# Patient Record
Sex: Female | Born: 1950 | Race: Black or African American | Hispanic: No | Marital: Single | Smoking: Former smoker
Health system: Southern US, Community
[De-identification: ages and names within clinical notes are randomized; demographics above are authoritative.]

## PROBLEM LIST (undated history)

## (undated) DIAGNOSIS — E785 Hyperlipidemia, unspecified: Secondary | ICD-10-CM

## (undated) DIAGNOSIS — I10 Essential (primary) hypertension: Secondary | ICD-10-CM

## (undated) DIAGNOSIS — M719 Bursopathy, unspecified: Secondary | ICD-10-CM

## (undated) HISTORY — PX: BREAST EXCISIONAL BIOPSY: SUR124

## (undated) HISTORY — PX: BYPASS GRAFT: SHX909

## (undated) HISTORY — DX: Bursopathy, unspecified: M71.9

## (undated) HISTORY — PX: REPLACEMENT TOTAL KNEE: SUR1224

## (undated) HISTORY — DX: Essential (primary) hypertension: I10

## (undated) HISTORY — DX: Hyperlipidemia, unspecified: E78.5

---

## 1997-12-25 ENCOUNTER — Ambulatory Visit (HOSPITAL_BASED_OUTPATIENT_CLINIC_OR_DEPARTMENT_OTHER): Admission: RE | Admit: 1997-12-25 | Discharge: 1997-12-25 | Payer: Self-pay | Admitting: Orthopedic Surgery

## 1998-02-21 ENCOUNTER — Inpatient Hospital Stay (HOSPITAL_COMMUNITY): Admission: RE | Admit: 1998-02-21 | Discharge: 1998-02-28 | Payer: Self-pay | Admitting: Orthopedic Surgery

## 1998-02-28 ENCOUNTER — Inpatient Hospital Stay (HOSPITAL_COMMUNITY)
Admission: RE | Admit: 1998-02-28 | Discharge: 1998-03-02 | Payer: Self-pay | Admitting: Physical Medicine and Rehabilitation

## 1998-05-14 ENCOUNTER — Ambulatory Visit (HOSPITAL_BASED_OUTPATIENT_CLINIC_OR_DEPARTMENT_OTHER): Admission: RE | Admit: 1998-05-14 | Discharge: 1998-05-14 | Payer: Self-pay | Admitting: Orthopedic Surgery

## 1998-07-13 ENCOUNTER — Ambulatory Visit (HOSPITAL_COMMUNITY): Admission: RE | Admit: 1998-07-13 | Discharge: 1998-07-13 | Payer: Self-pay | Admitting: *Deleted

## 1999-04-11 ENCOUNTER — Other Ambulatory Visit: Admission: RE | Admit: 1999-04-11 | Discharge: 1999-04-11 | Payer: Self-pay | Admitting: Obstetrics and Gynecology

## 1999-06-07 ENCOUNTER — Ambulatory Visit (HOSPITAL_COMMUNITY): Admission: RE | Admit: 1999-06-07 | Discharge: 1999-06-07 | Payer: Self-pay | Admitting: *Deleted

## 1999-06-07 ENCOUNTER — Encounter: Payer: Self-pay | Admitting: *Deleted

## 1999-08-26 ENCOUNTER — Ambulatory Visit (HOSPITAL_COMMUNITY): Admission: RE | Admit: 1999-08-26 | Discharge: 1999-08-26 | Payer: Self-pay | Admitting: Cardiology

## 1999-08-27 ENCOUNTER — Encounter: Payer: Self-pay | Admitting: Cardiology

## 1999-09-12 ENCOUNTER — Encounter: Payer: Self-pay | Admitting: Cardiothoracic Surgery

## 1999-09-12 ENCOUNTER — Inpatient Hospital Stay (HOSPITAL_COMMUNITY): Admission: RE | Admit: 1999-09-12 | Discharge: 1999-09-19 | Payer: Self-pay | Admitting: Cardiology

## 1999-09-13 ENCOUNTER — Encounter: Payer: Self-pay | Admitting: Cardiothoracic Surgery

## 1999-09-14 ENCOUNTER — Encounter: Payer: Self-pay | Admitting: Cardiothoracic Surgery

## 1999-09-15 ENCOUNTER — Encounter: Payer: Self-pay | Admitting: Cardiothoracic Surgery

## 1999-09-16 ENCOUNTER — Encounter: Payer: Self-pay | Admitting: Cardiothoracic Surgery

## 1999-09-17 ENCOUNTER — Encounter: Payer: Self-pay | Admitting: Cardiothoracic Surgery

## 1999-10-01 ENCOUNTER — Encounter (HOSPITAL_COMMUNITY): Admission: RE | Admit: 1999-10-01 | Discharge: 1999-10-27 | Payer: Self-pay | Admitting: Cardiology

## 1999-10-10 ENCOUNTER — Encounter: Admission: RE | Admit: 1999-10-10 | Discharge: 1999-10-10 | Payer: Self-pay | Admitting: Cardiothoracic Surgery

## 1999-10-10 ENCOUNTER — Encounter: Payer: Self-pay | Admitting: Cardiothoracic Surgery

## 1999-10-28 ENCOUNTER — Encounter (HOSPITAL_COMMUNITY): Admission: RE | Admit: 1999-10-28 | Discharge: 2000-01-26 | Payer: Self-pay | Admitting: Cardiology

## 1999-10-31 ENCOUNTER — Encounter: Payer: Self-pay | Admitting: Cardiothoracic Surgery

## 1999-10-31 ENCOUNTER — Encounter: Admission: RE | Admit: 1999-10-31 | Discharge: 1999-10-31 | Payer: Self-pay | Admitting: Cardiothoracic Surgery

## 1999-11-25 ENCOUNTER — Ambulatory Visit (HOSPITAL_COMMUNITY): Admission: RE | Admit: 1999-11-25 | Discharge: 1999-11-25 | Payer: Self-pay | Admitting: Cardiology

## 1999-11-25 ENCOUNTER — Encounter: Payer: Self-pay | Admitting: Cardiology

## 1999-12-27 ENCOUNTER — Ambulatory Visit (HOSPITAL_COMMUNITY): Admission: RE | Admit: 1999-12-27 | Discharge: 1999-12-27 | Payer: Self-pay | Admitting: Cardiology

## 1999-12-27 ENCOUNTER — Encounter: Payer: Self-pay | Admitting: Cardiology

## 2000-01-09 ENCOUNTER — Ambulatory Visit (HOSPITAL_COMMUNITY): Admission: RE | Admit: 2000-01-09 | Discharge: 2000-01-09 | Payer: Self-pay | Admitting: Cardiology

## 2000-01-27 ENCOUNTER — Encounter (HOSPITAL_COMMUNITY): Admission: RE | Admit: 2000-01-27 | Discharge: 2000-04-26 | Payer: Self-pay | Admitting: Cardiology

## 2000-04-27 ENCOUNTER — Encounter (HOSPITAL_COMMUNITY): Admission: RE | Admit: 2000-04-27 | Discharge: 2000-07-26 | Payer: Self-pay | Admitting: Cardiology

## 2000-06-30 ENCOUNTER — Ambulatory Visit (HOSPITAL_COMMUNITY): Admission: RE | Admit: 2000-06-30 | Discharge: 2000-06-30 | Payer: Self-pay | Admitting: Cardiology

## 2000-06-30 ENCOUNTER — Encounter: Payer: Self-pay | Admitting: Cardiology

## 2000-07-27 ENCOUNTER — Encounter (HOSPITAL_COMMUNITY): Admission: RE | Admit: 2000-07-27 | Discharge: 2000-10-25 | Payer: Self-pay | Admitting: Cardiology

## 2000-10-26 ENCOUNTER — Encounter (HOSPITAL_COMMUNITY): Admission: RE | Admit: 2000-10-26 | Discharge: 2001-01-24 | Payer: Self-pay | Admitting: Cardiology

## 2000-11-18 ENCOUNTER — Other Ambulatory Visit: Admission: RE | Admit: 2000-11-18 | Discharge: 2000-11-18 | Payer: Self-pay | Admitting: Obstetrics and Gynecology

## 2001-01-25 ENCOUNTER — Encounter (HOSPITAL_COMMUNITY): Admission: RE | Admit: 2001-01-25 | Discharge: 2001-04-25 | Payer: Self-pay | Admitting: Cardiology

## 2002-01-18 ENCOUNTER — Other Ambulatory Visit: Admission: RE | Admit: 2002-01-18 | Discharge: 2002-01-18 | Payer: Self-pay | Admitting: Obstetrics and Gynecology

## 2002-09-02 ENCOUNTER — Ambulatory Visit (HOSPITAL_COMMUNITY): Admission: RE | Admit: 2002-09-02 | Discharge: 2002-09-02 | Payer: Self-pay | Admitting: Cardiology

## 2003-03-01 ENCOUNTER — Ambulatory Visit (HOSPITAL_COMMUNITY): Admission: RE | Admit: 2003-03-01 | Discharge: 2003-03-01 | Payer: Self-pay | Admitting: Cardiology

## 2003-10-30 ENCOUNTER — Encounter: Admission: RE | Admit: 2003-10-30 | Discharge: 2003-10-30 | Payer: Self-pay | Admitting: Cardiology

## 2003-11-23 ENCOUNTER — Ambulatory Visit (HOSPITAL_COMMUNITY): Admission: RE | Admit: 2003-11-23 | Discharge: 2003-11-23 | Payer: Self-pay | Admitting: Obstetrics & Gynecology

## 2004-07-29 ENCOUNTER — Ambulatory Visit (HOSPITAL_COMMUNITY): Admission: RE | Admit: 2004-07-29 | Discharge: 2004-07-29 | Payer: Self-pay | Admitting: Cardiology

## 2004-07-30 ENCOUNTER — Ambulatory Visit (HOSPITAL_COMMUNITY): Admission: RE | Admit: 2004-07-30 | Discharge: 2004-07-30 | Payer: Self-pay | Admitting: Cardiology

## 2004-10-28 ENCOUNTER — Ambulatory Visit (HOSPITAL_BASED_OUTPATIENT_CLINIC_OR_DEPARTMENT_OTHER): Admission: RE | Admit: 2004-10-28 | Discharge: 2004-10-28 | Payer: Self-pay | Admitting: Cardiology

## 2004-10-28 ENCOUNTER — Ambulatory Visit: Payer: Self-pay | Admitting: Internal Medicine

## 2004-12-30 ENCOUNTER — Encounter: Admission: RE | Admit: 2004-12-30 | Discharge: 2004-12-30 | Payer: Self-pay | Admitting: Obstetrics & Gynecology

## 2005-08-04 ENCOUNTER — Encounter: Admission: RE | Admit: 2005-08-04 | Discharge: 2005-08-04 | Payer: Self-pay | Admitting: Cardiology

## 2005-08-21 ENCOUNTER — Ambulatory Visit (HOSPITAL_COMMUNITY): Admission: RE | Admit: 2005-08-21 | Discharge: 2005-08-21 | Payer: Self-pay | Admitting: Cardiology

## 2006-02-24 ENCOUNTER — Encounter: Admission: RE | Admit: 2006-02-24 | Discharge: 2006-02-24 | Payer: Self-pay | Admitting: Obstetrics & Gynecology

## 2006-09-24 ENCOUNTER — Encounter: Admission: RE | Admit: 2006-09-24 | Discharge: 2006-09-24 | Payer: Self-pay | Admitting: Gastroenterology

## 2006-12-16 ENCOUNTER — Encounter (HOSPITAL_COMMUNITY): Admission: RE | Admit: 2006-12-16 | Discharge: 2006-12-16 | Payer: Self-pay | Admitting: Cardiology

## 2007-02-26 ENCOUNTER — Encounter: Admission: RE | Admit: 2007-02-26 | Discharge: 2007-02-26 | Payer: Self-pay | Admitting: Obstetrics & Gynecology

## 2007-03-01 ENCOUNTER — Encounter: Admission: RE | Admit: 2007-03-01 | Discharge: 2007-03-01 | Payer: Self-pay | Admitting: Interventional Cardiology

## 2007-03-04 ENCOUNTER — Encounter: Admission: RE | Admit: 2007-03-04 | Discharge: 2007-03-04 | Payer: Self-pay | Admitting: Obstetrics & Gynecology

## 2007-03-08 ENCOUNTER — Inpatient Hospital Stay (HOSPITAL_BASED_OUTPATIENT_CLINIC_OR_DEPARTMENT_OTHER): Admission: RE | Admit: 2007-03-08 | Discharge: 2007-03-08 | Payer: Self-pay | Admitting: Interventional Cardiology

## 2007-09-06 ENCOUNTER — Ambulatory Visit (HOSPITAL_COMMUNITY): Admission: RE | Admit: 2007-09-06 | Discharge: 2007-09-06 | Payer: Self-pay | Admitting: Cardiology

## 2007-09-09 ENCOUNTER — Encounter (INDEPENDENT_AMBULATORY_CARE_PROVIDER_SITE_OTHER): Payer: Self-pay | Admitting: Cardiology

## 2007-09-09 ENCOUNTER — Ambulatory Visit (HOSPITAL_COMMUNITY): Admission: RE | Admit: 2007-09-09 | Discharge: 2007-09-09 | Payer: Self-pay | Admitting: Cardiology

## 2007-09-09 ENCOUNTER — Ambulatory Visit: Payer: Self-pay | Admitting: *Deleted

## 2008-03-28 ENCOUNTER — Encounter: Admission: RE | Admit: 2008-03-28 | Discharge: 2008-03-28 | Payer: Self-pay | Admitting: Obstetrics & Gynecology

## 2008-04-11 ENCOUNTER — Ambulatory Visit (HOSPITAL_COMMUNITY): Admission: RE | Admit: 2008-04-11 | Discharge: 2008-04-11 | Payer: Self-pay | Admitting: Obstetrics & Gynecology

## 2009-04-23 ENCOUNTER — Encounter: Admission: RE | Admit: 2009-04-23 | Discharge: 2009-04-23 | Payer: Self-pay | Admitting: Obstetrics & Gynecology

## 2009-10-17 ENCOUNTER — Ambulatory Visit (HOSPITAL_COMMUNITY): Admission: RE | Admit: 2009-10-17 | Discharge: 2009-10-17 | Payer: Self-pay | Admitting: Cardiology

## 2009-10-17 ENCOUNTER — Ambulatory Visit: Payer: Self-pay | Admitting: Vascular Surgery

## 2009-10-17 ENCOUNTER — Encounter (INDEPENDENT_AMBULATORY_CARE_PROVIDER_SITE_OTHER): Payer: Self-pay | Admitting: Cardiology

## 2010-05-15 ENCOUNTER — Encounter: Admission: RE | Admit: 2010-05-15 | Discharge: 2010-05-15 | Payer: Self-pay | Admitting: Obstetrics & Gynecology

## 2010-10-13 ENCOUNTER — Encounter: Payer: Self-pay | Admitting: Obstetrics & Gynecology

## 2011-02-04 NOTE — Cardiovascular Report (Signed)
NAMEISRAELLA, Felicia Petersen                ACCOUNT NO.:  [id number]   MEDICAL RECORD NO.:  [id number]          PATIENT TYPE:  OIB   LOCATION:  1965                         FACILITY:  [facility]   PHYSICIAN:  Lyn Records, M.D.   DATE OF BIRTH:  19-Oct-1950   DATE OF PROCEDURE:  03/08/2007  DATE OF DISCHARGE:                            CARDIAC CATHETERIZATION   REASON:  The patient has had recurring chest discomfort and palpitations  of uncertain etiology.  A Cardiolite study performed by Dr. Shana Chute  revealed apical ischemia.  This study is being done to rule out bypass  graft occlusion.  This will also help to exclude progression of disease  in non-bypass territories.   PROCEDURES PERFORMED:  1. Left heart catheterization.  2. Selective coronary angiography.  3. Left ventriculography.  4. Saphenous vein graft angiography.  5. Internal mammary graft angiography.   DESCRIPTION:  After informed consent, a 4-French sheath was placed in  the right femoral artery using the modified Seldinger technique.  A 4-  [place] A2 multipurpose catheter was used for hemodynamic recordings and  right coronary as well as saphenous vein graft coronary angiography.  We  used a 4-French #4 left Judkins catheter for left coronary angiography.  We used an internal mammary artery catheter to attempt right coronary  angiography but were unsuccessful.  We did perform left ventriculography  and ventricular hemodynamic recordings with an internal mammary  catheter.  We were able to direct the right internal mammary artery  catheter into the left subclavian.  Because of severe angulation from  the transverse aorta into the left subclavian, we were never able to  advance the catheter beyond the mid point in the subclavian without  prolapse of the catheter back into the aortic root and lumen.  We did  perform selective subclavian shots in the LAO and RAO projection that  demonstrated that the internal mammary artery was  patent to the LAD.  The case was terminated, and the patient was brought to the holding area  where hemostasis was achieved with manual compression.   RESULTS:  1. Hemodynamic data.      a.     Left ventricular pressure 131/6.      b.     Aortic pressure 132/71.   1. Left ventriculography:  Left ventricle is hyperdynamic.  LVEF is      greater than 60%. Ectopy is noted at this hand injection.   1. Coronary angiography.      a.     Left main coronary:  Ostial greater than 90% stenosis.  Flow       into the LAD is noted. First diagonal flow was noted.  No       significant obstruction was seen.      b.     Circumflex artery:  Poorly visualized.      c.     Ramus intermedius branch:  Poorly visualized.      d.     Right coronary:  The right coronary was difficult to       cannulate.  There is calcium in  the ostium.  The ostium appears to       be patent.  Irregularities and tortuosity are noted throughout the       proximal and mid right coronary.  This vessel contains diffuse       luminal irregularity, but no high-grade obstruction is seen in the       right coronary.   1. Bypass graft angiography.      a.     Saphenous vein graft to the ramus branch:  Widely patent as       is the ramus intermedius with reflux back into the left main.      b.     Saphenous vein graft to the obtuse marginal:  This graft is       widely patent; so is the circumflex with reflux of contrast back       into the left main.   1. Internal mammary graft to the LAD:  This mammary graft is patent.      It Fills the LAD.  We were unable to get a selective injection      because of aortosubclavian tortuosity.   CONCLUSION:  1. Widely patent saphenous vein grafts as outlined above.  2. Patent left internal mammary artery to the left anterior      descending.  3. Severe native vessel disease with 90% left main.  The left anterior      descending fills antegrade, and there is competitive flow with the       left internal mammary artery that supplies the mid left anterior      descending.  The diagonal branch of the left anterior descending is      also patent. Could not determine whether not there is obstruction      within this vessel.  4. Normal left ventricular function.  5. Patent native right coronary artery without significant obstructive      disease. Had difficulty selectively engaging the right coronary.   PLAN:  Continue ambulatory monitoring.  There does not appear to be any  significant obstructive disease present in the bypass grafts at this  time.   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      Lyn Records, M.D.  Electronically Signed     HWS/MEDQ  D:  03/08/2007  T:  03/08/2007  Job:  [id number]   cc:   Osvaldo Shipper. Spruill, M.D.

## 2011-02-07 NOTE — Op Note (Signed)
[place]  Patient:    Felicia Petersen                        MRN: [id number] Proc. Date: 09/12/99 Adm. Date:  [id number] Attending:  Waldo Laine CC:         Gwenith Daily. Tyrone Sage, M.D.             Eduardo Osier. Sharyn Lull, M.D.             Osvaldo Shipper. Spruill, M.D.                           Operative Report  PREOPERATIVE DIAGNOSIS:  Critical left main obstruction with ongoing chest pain.  POSTOPERATIVE DIAGNOSIS:  Critical left main obstruction with ongoing chest pain.  OPERATION:  Emergency coronary artery bypass grafting for critical left main disease.  Coronary artery bypass grafting x 3 with a left internal mammary artery to the left anterior descending coronary artery, reverse saphenous vein graft to the intermediate coronary artery, and reverse saphenous vein graft to the obtuse marginal coronary artery.  SURGEON:  Gwenith Daily. Tyrone Sage, M.D.  ASSISTANT:  Sherrie George, P.A.  INDICATION:  The patient is a 60 year old female with a history of hypertension, hyperlipidemia, and a positive family history for coronary artery occlusive disease at an early age.  In early December, she had had episodes of chest pain and had a Cardiolite stress test which was positive.  Today she came in for outpatient cardiac catheterization.  At home, she had been having on a regular basis significant anginal symptoms. During the catheterization, she had some episodes of hypertension. Catheterization revealed slightly depressed LV function with ejection fraction of approximately  45-50% and a critical left main ostial obstruction of a greater than 95%.  In addition, the obtuse marginal coronary had an 80% obstruction.  The right coronary artery had luminal irregularities but without great stenosis.  Because of the patients ongoing symptoms, positive stress test, and critical left main disease, emergency coronary artery bypass grafting was  recommended.  The patient agreed and signed informed consent.  DESCRIPTION OF PROCEDURE:  With Swan-Ganz catheter and arterial line monitors in place, the patient underwent general endotracheal anesthesia without incident. The skin of the chest and legs was prepped with Betadine and draped in the usual sterile manner.  A vein was harvested from the right lower extremity.  A median sternotomy was performed.  The left internal mammary artery was dissected down as a pedicle graft using the rural retractor.  This took some difficulty because of the significant obesity of the patient.  The pericardium was opened.  Overall ventricular function appeared preserved.  The patient was systemically heparinized.  Ascending aorta and the right atrium  were cannulated.  In the aortic root, vent cardioplegia needle was introduced into the ascending aorta.  The patient was placed on cardiopulmonary bypass at 2.4 liters per minute sq/m.  Sites of anastomoses were selected and dissected out of the epicardium.  The patients body temperature was cooled to 30 degrees.  Aortic cross clamp was applied.  Then 500 cc of cold blood potassium cardioplegia was administered with rapid diastolic arrest of the heart.  Myocardial septal temperature was monitored throughout the cross clamp.  Attention was turned first to the obtuse marginal coronary artery which was opened and admitted a 1.5 mm probe.  Using a running 7-0 Prolene, distal anastomosis was performed  with a segment of reverse saphenous vein graft.  The intermediate coronary artery was then opened and was 1.5 mm in size and of ood quality.  Using a running 7-0 Prolene, distal anastomosis was performed with a segment of reverse saphenous vein graft.  Attention was then turned to the left anterior descending coronary artery which was opened between the mid and distal third.  This was opened and admitted a 1.5 mm  probe both proximally and  distally and was a good quality vessel.  Using a running 8-0 Prolene, left internal mammary artery was anastomosed to the left anterior descending coronary artery with release of the Edwards bulldog on the mammary artery.  There was appropriate rise of myocardial septal temperature.  Aortic cross clamp was removed.  Total cross clamp was 47 minutes.  The patient required electrical defibrillation to return to a sinus rhythm. Partial occlusion clamp was placed on the ascending aorta.  Two punch aortotomies were performed.  Each of the two vein grafts were anastomosed to the ascending aorta.  Air was evacuated from the grafts and partial occlusion clamps were removed.  Sites of anastomoses were inspected and free of bleeding.  The patient was then ventilated, weaned from cardiopulmonary bypass on low dose  dopamine.  She remained hemodynamically stable and was decannulated in the usual fashion.  Protamine sulfate was administered with the operative field hemostatic. Two right atrial and two ventricular pacing wires were applied and graft markers applied.  Left pleural tube and two mediastinal tubes were left in place.  The pericardium was reapproximated.  The sternum was closed with #6 stainless steel wire.  The fascia closed with interrupted 0 Vicryl, running 3-0 Vicryl, and subcutaneous tissue.  A 3-0 Monocryl stitch was used subcutaneously.  Dry dressings were applied.  Sponge, needle, and instrument counts were reported as correct at completion of the procedure.  The patient tolerated the procedure without obvious complication and was transferred to the surgical intensive care unit for further postoperative care. Total cross clamp was 47 minutes.  Total pump time is 103 minutes.  The patient did not require any blood bank blood products during the operative procedure. DD:  09/12/99 TD:  09/14/99 Job: [id number]

## 2011-02-07 NOTE — Procedures (Signed)
NAME:  Felicia Petersen, Felicia Petersen                ACCOUNT NO.:  [id number]   MEDICAL RECORD NO.:  [id number]          PATIENT TYPE:  OUT   LOCATION:  SLEEP CENTER                 FACILITY:  [facility]   PHYSICIAN:  Clinton D. Maple Hudson, M.D. DATE OF BIRTH:  1950/12/02   DATE OF STUDY:  10/28/2004                              NOCTURNAL POLYSOMNOGRAM   REFERRING PHYSICIAN:  Donia Guiles, MD   INDICATIONS FOR STUDY:  Hypersomnia with sleep apnea. Epworth sleepiness  score 17/24, BMI 40.9, weight 240 pounds.   SLEEP ARCHITECTURE:  Total sleep time 324 minutes with sleep efficiency of  66%. Stage I was 9%, stage II was 73%, stages III and IV were absent, and  REM was 18% of total sleep time. Latency to sleep onset 18 minutes. Latency  to REM 99 minutes. Awake after sleep onset 140 minutes. Arousal index 50,  which is increased.   RESPIRATORY DATA:  Split study protocol. RDI 19.4 obstructive events per  hour indicating mild to moderate obstructive sleep apnea/hypopnea syndrome  before CPAP. This included 19 obstructive apneas and 37 hypopneas before  CPAP. Most sleep during the initial diagnostic phase of the study was  supine, so most events were recorded supine. REM RDI was 29. CPAP was  titrated to 7 CWP, RDI 0.8 per hour using a medium [employer] HC405  mask with heated humidifier.   OXYGEN DATA:  Mild snoring with oxygen desaturation to a nadir of 77% before  CPAP. After CPAP titration, oxygen saturation held 95% to 98% on room air.   CARDIAC DATA:  Sinus bradycardia 40 to 53 per minute.   MOVEMENT/PARASOMNIA:  Occasional leg jerk with insignificant effect on  sleep.   IMPRESSION/RECOMMENDATIONS:  1.  Mild to moderate obstructive sleep apnea/hypopnea syndrome, RDI of 19.4      per hour with mild snoring and oxygen desaturation to 77%.  2.  CPAP titration to 7 CWP using a medium [employer] HC405 mask with      heated humidifier.  3.  Sinus bradycardia.      CDY/MEDQ  D:  11/03/2004  12:00:02  T:  11/03/2004 20:28:16  Job:  [id number]

## 2011-02-07 NOTE — Cardiovascular Report (Signed)
[place]  Patient:    Felicia Petersen, Felicia Petersen                         MRN: [id number] Proc. Date: 01/09/00 Attending:  Eduardo Osier. Sharyn Lull, M.D. CC:         Osvaldo Shipper. Spruill, M.D.             Cardiac Catheterization Laboratory             Mohan N. Sharyn Lull, M.D.                        Cardiac Catheterization  PROCEDURE:  Left cardiac catheterization with selective left and right coronary  angiography, visualization of saphenous vein graft and left internal mammary artery graft via right groin using Judkins technique.  INDICATIONS FOR PROCEDURE:  Felicia Petersen is a 60 year old black female with a past  medical history significant for coronary artery disease, status post coronary artery bypass grafting for left main stenosis, hypertension, hypercholesterolemia, tobacco abuse, morbid obesity, positive family history of coronary artery disease, history of arthritis, complained of retrosternal and precordial chest pain with  exertion ______ with feeling weak and tired and short of breath relieved with rest.  The patient underwent Persantine Cardiolite on December 27, 1999, which showed recurrent ______ apical wall ischemia with an EF of 47%.  The patient denies rest angina or nocturnal angina.  Denies palpitations, lightheadedness or syncope.  Denies PND, orthopnea, leg swelling.  Denies fever, chills or cough.   PAST MEDICAL HISTORY:  As above.  PAST SURGICAL HISTORY:  She had coronary artery bypass grafting x 3 in December of 2000.  She had a LIMA to LAD and saphenous vein graft to ramus and saphenous vein graft to OM-1.  She also had right ______ cyst resection in 1991.  She had D&C n 1986.  She had multiple arthroscopic knees surgeries in the past.  She had left  total knee replacement.  MEDICATIONS:  She was on Hyzaar 50/12.5 mg p.o. q.d., Lasix 40 mg p.o. q.d., enteric-coated aspirin one tablet daily, Zocor 20 p.o. h.s., metoprolol 12.5 mg  twice  daily.  Due to recurrent chest pain and positive Persantine Cardiolite, the patient was  advised for catheterization, possible angioplasty.  DESCRIPTION OF PROCEDURE:  After obtaining the informed consent, the patient was brought to the catheterization lab and was placed on the fluoroscopy table. The right groin was prepped and draped in the usual fashion.  Xylocaine 2% was used for local anesthesia in the right groin.  With the help of a thin-walled needle, 6 French arterial sheath was placed.  The sheath was aspirated and flushed. Next, a 6 French left Judkins catheter was advanced over the wire under fluoroscopic guidance up the ascending aorta.  The wire was pulled out.  The catheter was aspirated and connected to the manifold.  The catheter was further advanced and  engaged into the left coronary ostium.  Multiple views of the left system were taken.  Next, the catheter was disengaged and was pulled out over the wire and as replaced with a 6 French right Judkins catheter, which was advanced over the wire under fluoroscopic guidance up to the ascending aorta.  The wire was pulled out. The catheter was aspirated and connected to the manifold.  The catheter was further advanced and engaged into the right coronary ostium.  Multiple views of the right coronary system were taken.  Next, the catheter was disengaged and was pulled out over the wire and was replaced with a 6 French left bypass catheter which was advanced over the wire under fluoroscopic guidance up the ascending aorta.  The  wire was pulled out.  The catheter was aspirated and connected to the manifold.  The catheter was further advanced and engaged into the saphenous vein graft to OM-1.  Multiple views of this graft were taken.  Next, the catheter was disengaged and was engaged into the saphenous vein graft of the ramus.  Multiple views of his graft were taken.  Next, the catheter was disengaged and was pulled  out over the wire and was replaced with 6 [place] LIMA diagnostic catheter, which was advanced over the wire under fluoroscopic guidance up to the ascending aorta.  The wire as pulled out.  The catheter was aspirated and connected to the manifold.  The catheter was further advanced over the wire and was engaged into the LIMA. Multiple views of the LIMA were taken.  Next, the catheter was disengaged and was pulled out over the wire and was replaced with a 6 French pigtail catheter which was advanced over the wire under fluoroscopic guidance up to the ascending aorta. The catheter was further advanced across the aortic valve into the LV.  LV pressures were recorded.  Next, LV-graphy was done in 30 degree RAO position. ost angiographic pressures were recorded from LV and then pullback pressures were recorded from the aorta.  There was no gradient across the aortic valve. Next, the pigtail catheter was pulled out over the wire.  The sheaths were aspirated and flushed.  The patient tolerated the procedure well.  FINDINGS:  The patient has apical wall hypokinesia, EF approximately 50%.  Left  main had 95% ostial stenosis.  LAD was 100% occluded in the midportion which was filling by LIMA.  Left circumflex was 100% occluded proximally.  RCA has 10-15% mid stenosis and 30-40% distal diffuse disease.  Saphenous vein graft to ramus was patent.  Saphenous vein graft to OM-1 was patent.  LIMA to LAD was patent.  The patient tolerated the procedure well.  There were no complications.  The patient will be discharged home this evening if hemodynamically stable. DD:  01/09/00 TD:  01/10/00 Job: [id number]

## 2011-02-07 NOTE — Consult Note (Signed)
Felicia Petersen. Felicia Petersen  Patient:    Felicia Petersen                        MRN: [id number] Proc. Date: 09/12/99 Adm. Date:  [id number] Attending:  Robynn Pane                          Consultation Report  REASON FOR CONSULTATION:  Critical left main with ongoing chest pain in catheterization lab.  HISTORY OF PRESENT ILLNESS:  The patient is a 60 year old female followed by Dr. Shana Chute.  The patient has a known history of hypertension, hypercholesterolemia, and morbid obesity.  She also has a strong family history of coronary artery disease.  She was seen by Dr. Sharyn Lull in the office on December 20 with numbness in her left arm and tightness in her chest with walking, usually relieved within five minutes with rest.  She had no nausea, vomiting, or diaphoresis and has no syncope.  She denies any PND or orthopnea.  She, on December 5, had undergone a Persantine stress test which revealed anterior and anteroapical ischemia.  Because of the patients ongoing chest pain, known positive stress test, cardiac catheterization as outpatient was arranged on December 21 by Dr. Sharyn Lull.  PAST MEDICAL HISTORY:  As noted above.  The patient specifically denies any history of diabetes.  PAST SURGICAL HISTORY:  Status post right breast cyst resection in 1991. Status post D&C in 1986.  She has had multiple orthopedic procedures and has had total  left knee replacement.  MEDICATIONS ON ADMISSION: 1. Norvasc 5 mg p.o. q.d. 2. Atenolol 50 mg p.o. q.d. 3. Lipitor 10 mg q.d. 4. Aspirin 1 a day.  ALLERGIES:  Remote history of rash to PENICILLIN.  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  She has smoked for 20 years but only one or two cigarettes a day. She denies any alcohol use.  FAMILY HISTORY:  Positive for father who died at age 76 of myocardial infarction. Mother died of lung cancer.  She has two brothers with  hypertension.  REVIEW OF SYSTEMS:  Noted above.  The patient denies any blood in her stool or urine.  She denies any previously known thyroid problems.  PHYSICAL EXAMINATION:  GENERAL:  The patient was in the catheterization lab following cardiac catheterization.  At the time of exam, she was not having chest pain but had had pain during the procedure.  She was awake and oriented.  VITAL SIGNS:  Blood pressure 135/60, heart rate 75 and sinus.  NECK:  She had no carotid bruits.  She had no palpable masses in her neck.  CARDIAC:  Normal rate and rhythm without murmur or gallop.  LUNGS:  Breath sounds were distant but without wheezing.  ABDOMEN:  Morbidly obese without any palpable organomegaly.  The caliber of the  aorta could not be palpated due to her size.  EXTREMITIES:  She has in incision over the left knee from total knee replacement. She has no pedal edema.  She has palpable but 1+ DP and PT pulses bilaterally.   LABORATORY DATA:  Cardiac catheterization films revealed high-grade, greater than 95% stenosis of the proximal ostial left main with 80% proximal OM and 60 to 70% proximal LAD lesion.  The right coronary artery has luminal irregularities but o high-grade stenosis.  Because of the patients critical left main disease, ongoing  chest pain, and positive cardiac catheterization, emergency coronary artery bypass grafting was  recommended to the patient.  The risks of the procedure and options were discussed with the patient in detail.  She is willing to Proceed.  The risk of death, infection, stroke, myocardial infarction, bleeding, and blood transfusion was discussed with the patient who is agreeable.  The risk of not proceeding, considering her ongoing symptoms of chest pain and very critical left main, was  also discussed with her.  This is an emergency consult on patient, Felicia Petersen.  The patient will be taken directly to the operating room. DD:   09/12/99 TD:  09/12/99 Job: [id number]

## 2011-06-26 ENCOUNTER — Other Ambulatory Visit: Payer: Self-pay | Admitting: Obstetrics & Gynecology

## 2011-06-26 DIAGNOSIS — Z1231 Encounter for screening mammogram for malignant neoplasm of breast: Secondary | ICD-10-CM

## 2011-07-03 ENCOUNTER — Ambulatory Visit
Admission: RE | Admit: 2011-07-03 | Discharge: 2011-07-03 | Disposition: A | Discharge status: Home / Self Care | Payer: Medicare HMO | Source: Ambulatory Visit | Attending: Obstetrics & Gynecology | Admitting: Obstetrics & Gynecology

## 2011-07-03 DIAGNOSIS — Z1231 Encounter for screening mammogram for malignant neoplasm of breast: Secondary | ICD-10-CM

## 2011-07-09 ENCOUNTER — Other Ambulatory Visit: Payer: Self-pay | Admitting: Obstetrics & Gynecology

## 2011-07-09 DIAGNOSIS — R928 Other abnormal and inconclusive findings on diagnostic imaging of breast: Secondary | ICD-10-CM

## 2011-07-22 ENCOUNTER — Ambulatory Visit
Admission: RE | Admit: 2011-07-22 | Discharge: 2011-07-22 | Disposition: A | Discharge status: Home / Self Care | Payer: Medicare HMO | Source: Ambulatory Visit | Attending: Obstetrics & Gynecology | Admitting: Obstetrics & Gynecology

## 2011-07-22 DIAGNOSIS — R928 Other abnormal and inconclusive findings on diagnostic imaging of breast: Secondary | ICD-10-CM

## 2012-03-09 ENCOUNTER — Other Ambulatory Visit (HOSPITAL_COMMUNITY): Payer: Self-pay | Admitting: Cardiology

## 2012-03-09 ENCOUNTER — Ambulatory Visit (HOSPITAL_COMMUNITY)
Admission: RE | Admit: 2012-03-09 | Discharge: 2012-03-09 | Disposition: A | Discharge status: Home / Self Care | Payer: Medicare HMO | Source: Ambulatory Visit | Attending: Cardiology | Admitting: Cardiology

## 2012-03-09 DIAGNOSIS — R52 Pain, unspecified: Secondary | ICD-10-CM

## 2012-03-09 DIAGNOSIS — M79609 Pain in unspecified limb: Secondary | ICD-10-CM | POA: Insufficient documentation

## 2012-08-02 ENCOUNTER — Other Ambulatory Visit (HOSPITAL_COMMUNITY): Payer: Self-pay | Admitting: Cardiology

## 2012-08-02 ENCOUNTER — Other Ambulatory Visit (HOSPITAL_COMMUNITY): Payer: Self-pay | Admitting: Vascular Surgery

## 2012-08-02 DIAGNOSIS — I7090 Unspecified atherosclerosis: Secondary | ICD-10-CM

## 2012-08-02 DIAGNOSIS — I2510 Atherosclerotic heart disease of native coronary artery without angina pectoris: Secondary | ICD-10-CM

## 2012-08-05 ENCOUNTER — Ambulatory Visit (HOSPITAL_COMMUNITY)
Admission: RE | Admit: 2012-08-05 | Discharge: 2012-08-05 | Disposition: A | Discharge status: Home / Self Care | Payer: Medicare HMO | Source: Ambulatory Visit | Attending: Cardiology | Admitting: Cardiology

## 2012-08-05 DIAGNOSIS — I2510 Atherosclerotic heart disease of native coronary artery without angina pectoris: Secondary | ICD-10-CM | POA: Insufficient documentation

## 2012-08-11 ENCOUNTER — Ambulatory Visit (HOSPITAL_COMMUNITY)
Admission: RE | Admit: 2012-08-11 | Discharge: 2012-08-11 | Disposition: A | Discharge status: Home / Self Care | Payer: Medicare HMO | Source: Ambulatory Visit | Attending: Cardiology | Admitting: Cardiology

## 2012-08-11 DIAGNOSIS — I7090 Unspecified atherosclerosis: Secondary | ICD-10-CM | POA: Insufficient documentation

## 2012-08-11 DIAGNOSIS — M79609 Pain in unspecified limb: Secondary | ICD-10-CM | POA: Insufficient documentation

## 2012-08-11 DIAGNOSIS — Z951 Presence of aortocoronary bypass graft: Secondary | ICD-10-CM | POA: Insufficient documentation

## 2012-08-11 DIAGNOSIS — I10 Essential (primary) hypertension: Secondary | ICD-10-CM | POA: Insufficient documentation

## 2012-08-11 DIAGNOSIS — I2510 Atherosclerotic heart disease of native coronary artery without angina pectoris: Secondary | ICD-10-CM | POA: Insufficient documentation

## 2012-08-11 NOTE — Progress Notes (Signed)
VASCULAR LAB PRELIMINARY  ARTERIAL  ABI completed:    RIGHT    LEFT    PRESSURE WAVEFORM  PRESSURE WAVEFORM  BRACHIAL 148 triphasic BRACHIAL 142 triphasic  DP 130 biphasic DP 160 biphasic         PT 174 biphasic PT 161 biphasic    RIGHT LEFT  ABI 1.18 1.09   ABIs and doppler waveforms are within normal limits at rest.  Symptoms not consistant with claudication.   Arzu Mcgaughey, RVS 08/11/2012, 9:09 AM

## 2013-03-07 ENCOUNTER — Other Ambulatory Visit: Payer: Self-pay

## 2013-03-07 DIAGNOSIS — Z1231 Encounter for screening mammogram for malignant neoplasm of breast: Secondary | ICD-10-CM

## 2013-04-08 ENCOUNTER — Ambulatory Visit
Admission: RE | Admit: 2013-04-08 | Discharge: 2013-04-08 | Disposition: A | Discharge status: Home / Self Care | Payer: Medicare HMO | Source: Ambulatory Visit

## 2013-04-08 DIAGNOSIS — Z1231 Encounter for screening mammogram for malignant neoplasm of breast: Secondary | ICD-10-CM

## 2013-05-09 ENCOUNTER — Other Ambulatory Visit: Payer: Self-pay | Admitting: Cardiology

## 2013-05-09 DIAGNOSIS — R2241 Localized swelling, mass and lump, right lower limb: Secondary | ICD-10-CM

## 2013-05-12 ENCOUNTER — Other Ambulatory Visit: Payer: Medicare HMO

## 2013-05-17 ENCOUNTER — Ambulatory Visit
Admission: RE | Admit: 2013-05-17 | Discharge: 2013-05-17 | Disposition: A | Discharge status: Home / Self Care | Payer: Medicare HMO | Source: Ambulatory Visit | Attending: Cardiology | Admitting: Cardiology

## 2013-05-17 DIAGNOSIS — R2241 Localized swelling, mass and lump, right lower limb: Secondary | ICD-10-CM

## 2013-05-17 MED ORDER — GADOBENATE DIMEGLUMINE 529 MG/ML IV SOLN
20.0000 mL | Freq: Once | INTRAVENOUS | Status: AC | PRN
  Administered 2013-05-17: 20 mL via INTRAVENOUS

## 2013-10-20 ENCOUNTER — Ambulatory Visit: Payer: Self-pay | Admitting: Obstetrics & Gynecology

## 2013-10-21 ENCOUNTER — Ambulatory Visit: Payer: Self-pay | Admitting: Obstetrics & Gynecology

## 2013-10-24 ENCOUNTER — Encounter: Payer: Self-pay | Admitting: Obstetrics & Gynecology

## 2013-10-24 ENCOUNTER — Ambulatory Visit (INDEPENDENT_AMBULATORY_CARE_PROVIDER_SITE_OTHER): Payer: Medicare HMO | Admitting: Obstetrics & Gynecology

## 2013-10-24 VITALS — BP 159/81 | HR 58 | Temp 97.7°F | Wt 252.0 lb

## 2013-10-24 DIAGNOSIS — Z01419 Encounter for gynecological examination (general) (routine) without abnormal findings: Secondary | ICD-10-CM

## 2013-10-24 NOTE — Progress Notes (Signed)
Subjective:     Felicia Petersen is a 63 y.o. female here for a routine exam.  Current complaints: pt has no complaints today.  Personal health questionnaire reviewed: yes.   Gynecologic History No LMP recorded. Patient is postmenopausal. Last Pap: 2013. Results were: normal Last mammogram: 2014. Results were: normal  Obstetric History OB History  No data available     The following portions of the patient's history were reviewed and updated as appropriate: allergies, current medications, past family history, past medical history, past social history, past surgical history and problem list.  Review of Systems Pertinent items are noted in HPI.    Objective:    BP 159/81  Pulse 58  Temp(Src) 97.7 F (36.5 C)  Wt 252 lb (114.306 kg)      General appearance: alert Breasts: normal appearance, no masses or tenderness Abdomen: soft, non-tender; bowel sounds normal; no masses,  no organomegaly Pelvic: cervix normal in appearance, external genitalia normal, no adnexal masses or tenderness, uterus normal size, shape, and consistency and vagina normal without discharge    Assessment:    Healthy female exam.    Plan:    Follow up in: 2 years. or as needed.

## 2013-10-24 NOTE — Addendum Note (Signed)
Addended by: Marya LandryFOSTER, Katrinna Travieso D on: 10/24/2013 04:54 PM   Modules accepted: Orders

## 2013-10-24 NOTE — Patient Instructions (Signed)

## 2013-10-26 LAB — PAP IG AND HPV HIGH-RISK: HPV DNA High Risk: NOT DETECTED

## 2013-10-29 ENCOUNTER — Encounter (HOSPITAL_COMMUNITY): Payer: Self-pay | Admitting: Emergency Medicine

## 2013-10-29 ENCOUNTER — Emergency Department (HOSPITAL_COMMUNITY)
Admission: EM | Admit: 2013-10-29 | Discharge: 2013-10-29 | Disposition: A | Discharge status: Home / Self Care | Payer: Medicare HMO | Source: Home / Self Care | Attending: Family Medicine | Admitting: Family Medicine

## 2013-10-29 DIAGNOSIS — J029 Acute pharyngitis, unspecified: Secondary | ICD-10-CM

## 2013-10-29 DIAGNOSIS — B309 Viral conjunctivitis, unspecified: Secondary | ICD-10-CM

## 2013-10-29 MED ORDER — IPRATROPIUM BROMIDE 0.06 % NA SOLN: 2.0000 | Freq: Four times a day (QID) | NASAL | Status: DC

## 2013-10-29 MED ORDER — PREDNISONE 50 MG PO TABS: 50.0000 mg | ORAL_TABLET | Freq: Every day | ORAL | Status: DC

## 2013-10-29 NOTE — ED Provider Notes (Signed)
Felicia Petersen is a 63 y.o. female who presents to Urgent Care today for 3 days of sore throat hoarse voice right ear pain and bilateral eye conjunctival injection. This is associated with mild watery eyes. She denies any fever nausea vomiting diarrhea or trouble breathing. She's tried NyQuil and a throat lozenge which has helped a little. She notes that she did not take when her blood pressure medications this morning. She denies any chest pain palpitations or syncope. She feels well otherwise.   Past Medical History  Diagnosis Date  . Hyperlipidemia   . Hypertension    History  Substance Use Topics  . Smoking status: Former Games developermoker  . Smokeless tobacco: Not on file  . Alcohol Use: No   ROS as above Medications: No current facility-administered medications for this encounter.   Current Outpatient Prescriptions  Medication Sig Dispense Refill  . atenolol (TENORMIN) 50 MG tablet Take 50 mg by mouth daily.      . valsartan (DIOVAN) 320 MG tablet Take 320 mg by mouth daily.      Marland Kitchen. amLODipine (NORVASC) 10 MG tablet Take 10 mg by mouth daily.      Marland Kitchen. aspirin 81 MG tablet Take 81 mg by mouth daily.      . Cholecalciferol (VITAMIN D-3) 1000 UNITS CAPS Take by mouth.      . clopidogrel (PLAVIX) 75 MG tablet Take 75 mg by mouth daily with breakfast.      . cycloSPORINE (RESTASIS) 0.05 % ophthalmic emulsion 1 drop 2 (two) times daily.      . furosemide (LASIX) 40 MG tablet Take 40 mg by mouth.      Marland Kitchen. ipratropium (ATROVENT) 0.06 % nasal spray Place 2 sprays into both nostrils 4 (four) times daily.  15 mL  1  . Multiple Vitamin (MULTIVITAMIN) tablet Take 1 tablet by mouth daily.      . predniSONE (DELTASONE) 50 MG tablet Take 1 tablet (50 mg total) by mouth daily.  5 tablet  0    Exam:  BP 173/100  Pulse 63  Temp(Src) 98.7 F (37.1 C) (Oral)  Resp 18  SpO2 98% Gen: Well NAD HEENT: EOMI,  MMM posterior pharynx with cobblestoning. Tympanic membranes are normal appearing bilaterally.  Bilateral conjunctival are mildly injected with watery discharge. PERRLA Lungs: Normal work of breathing. CTABL Heart: RRR no MRG Abd: NABS, Soft. NT, ND Exts: Brisk capillary refill, warm and well perfused.    Assessment and Plan: 63 y.o. female with viral conjunctivitis and pharyngitis. Plan to treat with prednisone, Atrovent nasal spray, and sustain artificial tears. Followup with primary care provider.   Discussed warning signs or symptoms. Please see discharge instructions. Patient expresses understanding.    Rodolph BongEvan S Antwian Santaana, MD 10/29/13 [phone]

## 2013-10-29 NOTE — Discharge Instructions (Signed)
Thank you for coming in today. Take prednisone daily for 5 days.  Use atrovent nasal spray as needed.  Use Systane artificial tears eyedrops over-the-counter as needed for eye irritation. Taking your blood pressure medication please Call or go to the emergency room if you get worse, have trouble breathing, have chest pains, or palpitations.    Adenovirus Adenoviruses are viruses that usually cause breathing problems. They may also cause other illnesses, such as stomach flu, bladder infection, and rashes. CAUSES  Adenoviruses are passed by direct contact. This can happen from touching the contaminated hands of someone who has just gone to the bathroom. It can also be passed through contaminated water.  You may have the virus and give it to others without being sick yourself.  Some types of this virus occur naturally in most parts of the world. Most of these infections occur in children.  Epidemics are often centered around swimming pools and small lakes. Symptoms can include fever and pink eye.  Adenovirus 7 is a specific virus gotten by breathing in the virus. It typically causes severe problems in the breathing system. Patients who get the adenovirus by the mouth usually have less severe symptoms. Adenovirus caught by breathing in the virus is more common in the late winter, spring, and early summer. SYMPTOMS  Symptoms vary and can include:   Common cold symptoms.  Pneumonia.  Croup.  Bronchitis. Patients with HIV, transplant patients, and some cancer patients are more likely to have severe problems. Acute respiratory disease (ARD) can be caused by adenovirus in crowded conditions.  These viruses are not easily killed with common cleaning products. DIAGNOSIS  Blood tests can be used to identify the problem.  TREATMENT  Most infections are mild and require no therapy. The symptoms can be treated to make the patient comfortable.  Document Released: 11/29/2002 Document Revised:  12/01/2011 Document Reviewed: 07/14/2007 [facility] Patient Information 2014 [place], [place].

## 2013-10-29 NOTE — ED Notes (Signed)
Pt c/o cold sxs onset Tuesday Sxs include: ST, right ear pain, bilateral pink eye w/ d/c, laryngitis, productive cough Denies: f/v/n/d Alert w/no signs of acute distress.

## 2014-03-08 ENCOUNTER — Other Ambulatory Visit (HOSPITAL_COMMUNITY): Payer: Self-pay | Admitting: Cardiology

## 2014-03-08 DIAGNOSIS — R079 Chest pain, unspecified: Secondary | ICD-10-CM

## 2014-03-20 ENCOUNTER — Encounter (HOSPITAL_COMMUNITY)
Admission: RE | Admit: 2014-03-20 | Discharge: 2014-03-20 | Disposition: A | Discharge status: Home / Self Care | Payer: Medicare HMO | Source: Ambulatory Visit | Attending: Cardiology | Admitting: Cardiology

## 2014-03-20 DIAGNOSIS — R079 Chest pain, unspecified: Secondary | ICD-10-CM | POA: Insufficient documentation

## 2014-03-20 MED ORDER — TECHNETIUM TC 99M SESTAMIBI GENERIC - CARDIOLITE
10.0000 | Freq: Once | INTRAVENOUS | Status: AC | PRN
  Administered 2014-03-20: 10 via INTRAVENOUS

## 2014-03-20 MED ORDER — REGADENOSON 0.4 MG/5ML IV SOLN: 0.4000 mg | Freq: Once | INTRAVENOUS | Status: DC

## 2014-03-20 MED ORDER — REGADENOSON 0.4 MG/5ML IV SOLN
INTRAVENOUS | Status: AC
  Administered 2014-03-20: 0.4 mg
  Filled 2014-03-20: qty 5

## 2014-03-20 MED ORDER — TECHNETIUM TC 99M SESTAMIBI GENERIC - CARDIOLITE
30.0000 | Freq: Once | INTRAVENOUS | Status: AC | PRN
  Administered 2014-03-20: 30 via INTRAVENOUS

## 2014-06-05 ENCOUNTER — Other Ambulatory Visit: Payer: Self-pay

## 2014-06-05 ENCOUNTER — Other Ambulatory Visit: Payer: Self-pay | Admitting: Obstetrics & Gynecology

## 2014-06-05 DIAGNOSIS — Z1231 Encounter for screening mammogram for malignant neoplasm of breast: Secondary | ICD-10-CM

## 2014-06-13 ENCOUNTER — Ambulatory Visit
Admission: RE | Admit: 2014-06-13 | Discharge: 2014-06-13 | Disposition: A | Discharge status: Home / Self Care | Payer: Commercial Managed Care - HMO | Source: Ambulatory Visit

## 2014-06-13 DIAGNOSIS — Z1231 Encounter for screening mammogram for malignant neoplasm of breast: Secondary | ICD-10-CM

## 2014-06-25 ENCOUNTER — Emergency Department (HOSPITAL_COMMUNITY)
Admission: EM | Admit: 2014-06-25 | Discharge: 2014-06-25 | Disposition: A | Discharge status: Home / Self Care | Payer: Medicare HMO | Source: Home / Self Care | Attending: Family Medicine | Admitting: Family Medicine

## 2014-06-25 ENCOUNTER — Encounter (HOSPITAL_COMMUNITY): Payer: Self-pay | Admitting: Emergency Medicine

## 2014-06-25 DIAGNOSIS — M546 Pain in thoracic spine: Secondary | ICD-10-CM

## 2014-06-25 MED ORDER — CYCLOBENZAPRINE HCL 5 MG PO TABS: 5.0000 mg | ORAL_TABLET | Freq: Three times a day (TID) | ORAL | Status: DC | PRN

## 2014-06-25 NOTE — ED Provider Notes (Signed)
CSN: [id number]     Arrival date & time 06/25/14  1505 History   First MD Initiated Contact with Patient 06/25/14 1529     Chief Complaint  Patient presents with  . Rib Injury   (Consider location/radiation/quality/duration/timing/severity/associated sxs/prior Treatment) HPI Comments: Patient reports a 6 week history of discomfort under right scapular when she turns her torso or reaches with her arms in certain directions. Denies recent illness or injury, cough, fever, chest pain, dyspnea, hemoptysis, rash, nausea, vomiting, hematuria, dysuria, diaphoresis.  Has been using Aleve intermittently for pain with some relief. PCP: Dr. Shana ChuteSpruill  The history is provided by the patient.    Past Medical History  Diagnosis Date  . Hyperlipidemia   . Hypertension    Past Surgical History  Procedure Laterality Date  . Bypass graft      triple bypass  . Replacement total knee Left    Family History  Problem Relation Age of Onset  . Cancer Mother   . Heart disease Father   . Cancer Sister   . Heart disease Sister   . Diabetes Brother    History  Substance Use Topics  . Smoking status: Former Games developermoker  . Smokeless tobacco: Not on file  . Alcohol Use: No   OB History   Grav Para Term Preterm Abortions TAB SAB Ect Mult Living   2 2 2       2      Review of Systems  All other systems reviewed and are negative.   Allergies  Fish allergy and Penicillins  Home Medications   Prior to Admission medications   Medication Sig Start Date End Date Taking? Authorizing Provider  amLODipine (NORVASC) 10 MG tablet Take 10 mg by mouth daily.    Historical Provider, MD  aspirin 81 MG tablet Take 81 mg by mouth daily.    Historical Provider, MD  atenolol (TENORMIN) 50 MG tablet Take 50 mg by mouth daily.    Historical Provider, MD  Cholecalciferol (VITAMIN D-3) 1000 UNITS CAPS Take by mouth.    Historical Provider, MD  clopidogrel (PLAVIX) 75 MG tablet Take 75 mg by mouth daily with breakfast.     Historical Provider, MD  cyclobenzaprine (FLEXERIL) 5 MG tablet Take 1 tablet (5 mg total) by mouth 3 (three) times daily as needed for muscle spasms. 06/25/14   Mathis FareJennifer Lee H Presson, PA  cycloSPORINE (RESTASIS) 0.05 % ophthalmic emulsion 1 drop 2 (two) times daily.    Historical Provider, MD  furosemide (LASIX) 40 MG tablet Take 40 mg by mouth.    Historical Provider, MD  ipratropium (ATROVENT) 0.06 % nasal spray Place 2 sprays into both nostrils 4 (four) times daily. 10/29/13   Rodolph BongEvan S Corey, MD  Multiple Vitamin (MULTIVITAMIN) tablet Take 1 tablet by mouth daily.    Historical Provider, MD  predniSONE (DELTASONE) 50 MG tablet Take 1 tablet (50 mg total) by mouth daily. 10/29/13   Rodolph BongEvan S Corey, MD  valsartan (DIOVAN) 320 MG tablet Take 320 mg by mouth daily.    Historical Provider, MD   BP 144/73  Pulse 60  Temp(Src) 99 F (37.2 C) (Oral)  Resp 20  SpO2 98% Physical Exam  Nursing note and vitals reviewed. Constitutional: She is oriented to person, place, and time. She appears well-developed and well-nourished.  HENT:  Head: Normocephalic and atraumatic.  Eyes: Conjunctivae are normal. No scleral icterus.  Cardiovascular: Normal rate, regular rhythm and normal heart sounds.   Pulmonary/Chest: Effort normal and breath sounds normal.  No respiratory distress. She has no decreased breath sounds. She has no wheezes.    Musculoskeletal: Normal range of motion.  Neurological: She is alert and oriented to person, place, and time.  Skin: Skin is warm and dry.  Psychiatric: She has a normal mood and affect. Her behavior is normal.    ED Course  Procedures (including critical care time) Labs Review Labs Reviewed - No data to display  Imaging Review No results found.   MDM   1. Right-sided thoracic back pain   Paying full respect to the fact that this patient has a hx of cardiovascular disease, she was revascularized with 3 vessel CABG in 2000 and states that current episode of back  discomfort in no way resembles symptoms prior to CABG. I do not feel current symptoms indicative of ACS of angina. Findings are completely reproducible with physical ROM and palpation and suggest a strain or muscle spasm of right latissimus dorsi muscle. Will advise using tylenol as directed on packaging for pain and Flexeril as prescribed for muscle spasm and advise follow up with PCP if symptoms do not improve.     Ria Clock, [place] 06/25/14 [phone]

## 2014-06-25 NOTE — Discharge Instructions (Signed)
Back Pain, Adult °Low back pain is very common. About 1 in 5 people have back pain. The cause of low back pain is rarely dangerous. The pain often gets better over time. About half of people with a sudden onset of back pain feel better in just 2 weeks. About 8 in 10 people feel better by 6 weeks.  °CAUSES °Some common causes of back pain include: °· Strain of the muscles or ligaments supporting the spine. °· Wear and tear (degeneration) of the spinal discs. °· Arthritis. °· Direct injury to the back. °DIAGNOSIS °Most of the time, the direct cause of low back pain is not known. However, back pain can be treated effectively even when the exact cause of the pain is unknown. Answering your caregiver's questions about your overall health and symptoms is one of the most accurate ways to make sure the cause of your pain is not dangerous. If your caregiver needs more information, he or she may order lab work or imaging tests (X-rays or MRIs). However, even if imaging tests show changes in your back, this usually does not require surgery. °HOME CARE INSTRUCTIONS °For many people, back pain returns. Since low back pain is rarely dangerous, it is often a condition that people can learn to manage on their own.  °· Remain active. It is stressful on the back to sit or stand in one place. Do not sit, drive, or stand in one place for more than 30 minutes at a time. Take short walks on level surfaces as soon as pain allows. Try to increase the length of time you walk each day. °· Do not stay in bed. Resting more than 1 or 2 days can delay your recovery. °· Do not avoid exercise or work. Your body is made to move. It is not dangerous to be active, even though your back may hurt. Your back will likely heal faster if you return to being active before your pain is gone. °· Pay attention to your body when you  bend and lift. Many people have less discomfort when lifting if they bend their knees, keep the load close to their bodies, and  avoid twisting. Often, the most comfortable positions are those that put less stress on your recovering back. °· Find a comfortable position to sleep. Use a firm mattress and lie on your side with your knees slightly bent. If you lie on your back, put a pillow under your knees. °· Only take over-the-counter or prescription medicines as directed by your caregiver. Over-the-counter medicines to reduce pain and inflammation are often the most helpful. Your caregiver may prescribe muscle relaxant drugs. These medicines help dull your pain so you can more quickly return to your normal activities and healthy exercise. °· Put ice on the injured area. °¨ Put ice in a plastic bag. °¨ Place a towel between your skin and the bag. °¨ Leave the ice on for 15-20 minutes, 03-04 times a day for the first 2 to 3 days. After that, ice and heat may be alternated to reduce pain and spasms. °· Ask your caregiver about trying back exercises and gentle massage. This may be of some benefit. °· Avoid feeling anxious or stressed. Stress increases muscle tension and can worsen back pain. It is important to recognize when you are anxious or stressed and learn ways to manage it. Exercise is a great option. °SEEK MEDICAL CARE IF: °· You have pain that is not relieved with rest or medicine. °· You have pain that does not improve in 1 week. °· You have new symptoms. °· You are generally not feeling well. °SEEK   IMMEDIATE MEDICAL CARE IF:   You have pain that radiates from your back into your legs.  You develop new bowel or bladder control problems.  You have unusual weakness or numbness in your arms or legs.  You develop nausea or vomiting.  You develop abdominal pain.  You feel faint. Document Released: 09/08/2005 Document Revised: 03/09/2012 Document Reviewed: 01/10/2014 [facility] Patient Information 2015 [place], [place]. This information is not intended to replace advice given to you by your health care provider. Make sure you  discuss any questions you have with your health care provider.  Back Exercises Back exercises help treat and prevent back injuries. The goal of back exercises is to increase the strength of your abdominal and back muscles and the flexibility of your back. These exercises should be started when you no longer have back pain. Back exercises include:  Pelvic Tilt. Lie on your back with your knees bent. Tilt your pelvis until the lower part of your back is against the floor. Hold this position 5 to 10 sec and repeat 5 to 10 times.  Knee to Chest. Pull first 1 knee up against your chest and hold for 20 to 30 seconds, repeat this with the other knee, and then both knees. This may be done with the other leg straight or bent, whichever feels better.  Sit-Ups or Curl-Ups. Bend your knees 90 degrees. Start with tilting your pelvis, and do a partial, slow sit-up, lifting your trunk only 30 to 45 degrees off the floor. Take at least 2 to 3 seconds for each sit-up. Do not do sit-ups with your knees out straight. If partial sit-ups are difficult, simply do the above but with only tightening your abdominal muscles and holding it as directed.  Hip-Lift. Lie on your back with your knees flexed 90 degrees. Push down with your feet and shoulders as you raise your hips a couple inches off the floor; hold for 10 seconds, repeat 5 to 10 times.  Back arches. Lie on your stomach, propping yourself up on bent elbows. Slowly press on your hands, causing an arch in your low back. Repeat 3 to 5 times. Any initial stiffness and discomfort should lessen with repetition over time.  Shoulder-Lifts. Lie face down with arms beside your body. Keep hips and torso pressed to floor as you slowly lift your head and shoulders off the floor. Do not overdo your exercises, especially in the beginning. Exercises may cause you some mild back discomfort which lasts for a few minutes; however, if the pain is more severe, or lasts for more than 15  minutes, do not continue exercises until you see your caregiver. Improvement with exercise therapy for back problems is slow.  See your caregivers for assistance with developing a proper back exercise program. Document Released: 10/16/2004 Document Revised: 12/01/2011 Document Reviewed: 07/10/2011 [facility] Patient Information 2015 [place], [place]. This information is not intended to replace advice given to you by your health care provider. Make sure you discuss any questions you have with your health care provider.  Heat Therapy Heat therapy can help ease sore, stiff, injured, and tight muscles and joints. Heat relaxes your muscles, which may help ease your pain.  RISKS AND COMPLICATIONS If you have any of the following conditions, do not use heat therapy unless your health care provider has approved:  Poor circulation.  Healing wounds or scarred skin in the area being treated.  Diabetes, heart disease, or high blood pressure.  Not being able to feel (numbness) the area  being treated.  Unusual swelling of the area being treated.  Active infections.  Blood clots.  Cancer.  Inability to communicate pain. This may include young children and people who have problems with their brain function (dementia).  Pregnancy. Heat therapy should only be used on old, pre-existing, or long-lasting (chronic) injuries. Do not use heat therapy on new injuries unless directed by your health care provider. HOW TO USE HEAT THERAPY There are several different kinds of heat therapy, including:  Moist heat pack.  Warm water bath.  Hot water bottle.  Electric heating pad.  Heated gel pack.  Heated wrap.  Electric heating pad. Use the heat therapy method suggested by your health care provider. Follow your health care provider's instructions on when and how to use heat therapy. GENERAL HEAT THERAPY RECOMMENDATIONS  Do not sleep while using heat therapy. Only use heat therapy while you are  awake.  Your skin may turn pink while using heat therapy. Do not use heat therapy if your skin turns red.  Do not use heat therapy if you have new pain.  High heat or long exposure to heat can cause burns. Be careful when using heat therapy to avoid burning your skin.  Do not use heat therapy on areas of your skin that are already irritated, such as with a rash or sunburn. SEEK MEDICAL CARE IF:  You have blisters, redness, swelling, or numbness.  You have new pain.  Your pain is worse. MAKE SURE YOU:  Understand these instructions.  Will watch your condition.  Will get help right away if you are not doing well or get worse. Document Released: 12/01/2011 Document Revised: 01/23/2014 Document Reviewed: 11/01/2013 [facility] Patient Information 2015 [place], [place]. This information is not intended to replace advice given to you by your health care provider. Make sure you discuss any questions you have with your health care provider.  Thoracic Strain Thoracic strain is an injury to the muscles of the upper back. A mild strain may take only 1 week to heal. Torn muscles or tendons may take 6 weeks to 2 months to heal. HOME CARE  Put ice on the injured area.  Put ice in a plastic bag.  Place a towel between your skin and the bag.  Leave the ice on for 15-20 minutes, 03-04 times a day, for the first 2 days.  Only take medicine as told by your doctor.  Go to physical therapy and perform exercises as told by your doctor.  Use wraps and back braces as told by your doctor.  Warm up before being active. GET HELP RIGHT AWAY IF:   There is more bruising, puffiness (swelling), or pain.  Medicine does not help the pain.  You have trouble breathing, chest pain, or a fever.  Your problems seem to be getting worse, not better. MAKE SURE YOU:   Understand these instructions.  Will watch your condition.  Will get help right away if you are not doing well or get worse. Document  Released: 02/25/2008 Document Revised: 12/01/2011 Document Reviewed: 10/28/2010 [facility] Patient Information 2015 [place], [place]. This information is not intended to replace advice given to you by your health care provider. Make sure you discuss any questions you have with your health care provider.

## 2014-06-25 NOTE — ED Notes (Signed)
Patient reports pain around right side of body, right sharp, gripping pain that is aggravated by movement.  No known injury.  No vomiting, no nausea.  Last bm was this am and was normal per patient

## 2014-06-27 NOTE — ED Provider Notes (Signed)
Medical screening examination/treatment/procedure(s) were performed by resident physician or non-physician practitioner and as supervising physician I was immediately available for consultation/collaboration.   KINDL,JAMES DOUGLAS MD.   James D Kindl, MD 06/27/14 1145 

## 2014-07-24 ENCOUNTER — Encounter (HOSPITAL_COMMUNITY): Payer: Self-pay | Admitting: Emergency Medicine

## 2014-09-18 ENCOUNTER — Encounter: Payer: Self-pay | Admitting: *Deleted

## 2014-09-19 ENCOUNTER — Encounter: Payer: Self-pay | Admitting: Obstetrics & Gynecology

## 2015-06-18 ENCOUNTER — Other Ambulatory Visit: Payer: Self-pay

## 2015-06-18 DIAGNOSIS — Z1231 Encounter for screening mammogram for malignant neoplasm of breast: Secondary | ICD-10-CM

## 2015-06-20 ENCOUNTER — Encounter: Payer: Self-pay | Admitting: Certified Nurse Midwife

## 2015-06-20 ENCOUNTER — Ambulatory Visit (INDEPENDENT_AMBULATORY_CARE_PROVIDER_SITE_OTHER): Payer: Medicare HMO | Admitting: Certified Nurse Midwife

## 2015-06-20 ENCOUNTER — Other Ambulatory Visit: Payer: Self-pay | Admitting: Certified Nurse Midwife

## 2015-06-20 ENCOUNTER — Other Ambulatory Visit: Payer: Self-pay

## 2015-06-20 VITALS — BP 120/76 | HR 56 | Temp 98.3°F | Wt 238.0 lb

## 2015-06-20 DIAGNOSIS — N644 Mastodynia: Secondary | ICD-10-CM

## 2015-06-20 NOTE — Progress Notes (Signed)
Patient ID: Felicia Petersen, female   DOB: [birth year], 64 y.o.   MRN: [id number]    Chief Complaint  Patient presents with  . Breast Problem    HPI Felicia Petersen is a 64 y.o. female.  Here for a breast exam, needs mammogram.  States that her nipples have been sore for about a week an a half.  Has not had a mammogram in a while and is due.  Tried to self schedule but since she was having nipple pain bilaterally was told to have an exam before getting her mammogram.  Has not had a recent bra fitting.  Does not have any nipple discharge.  Has not been performing SBEs.  HPI  Past Medical History  Diagnosis Date  . Hyperlipidemia   . Hypertension     Past Surgical History  Procedure Laterality Date  . Bypass graft      triple bypass  . Replacement total knee Left     Family History  Problem Relation Age of Onset  . Cancer Mother   . Heart disease Father   . Cancer Sister   . Heart disease Sister   . Diabetes Brother     Social History Social History  Substance Use Topics  . Smoking status: Former Games developer  . Smokeless tobacco: Not on file  . Alcohol Use: No    Allergies  Allergen Reactions  . Fish Allergy   . Penicillins Hives    Current Outpatient Prescriptions  Medication Sig Dispense Refill  . amLODipine (NORVASC) 10 MG tablet Take 10 mg by mouth daily.    Marland Kitchen aspirin 81 MG tablet Take 81 mg by mouth daily.    Marland Kitchen atenolol (TENORMIN) 50 MG tablet Take 50 mg by mouth daily.    . Cholecalciferol (VITAMIN D-3) 1000 UNITS CAPS Take by mouth.    . cyclobenzaprine (FLEXERIL) 5 MG tablet Take 1 tablet (5 mg total) by mouth 3 (three) times daily as needed for muscle spasms. 15 tablet 0  . cycloSPORINE (RESTASIS) 0.05 % ophthalmic emulsion 1 drop 2 (two) times daily.    . furosemide (LASIX) 40 MG tablet Take 40 mg by mouth.    . Multiple Vitamin (MULTIVITAMIN) tablet Take 1 tablet by mouth daily.    . potassium chloride (K-DUR) 10 MEQ tablet Take 10 mEq by mouth daily.    .  valsartan (DIOVAN) 320 MG tablet Take 320 mg by mouth daily.    . clopidogrel (PLAVIX) 75 MG tablet Take 75 mg by mouth daily with breakfast.    . ipratropium (ATROVENT) 0.06 % nasal spray Place 2 sprays into both nostrils 4 (four) times daily. (Patient not taking: Reported on 06/20/2015) 15 mL 1  . predniSONE (DELTASONE) 50 MG tablet Take 1 tablet (50 mg total) by mouth daily. (Patient not taking: Reported on 06/20/2015) 5 tablet 0   No current facility-administered medications for this visit.    Review of Systems Review of Systems Constitutional: negative for fatigue and weight loss Respiratory: negative for cough and wheezing Cardiovascular: negative for chest pain, fatigue and palpitations Gastrointestinal: negative for abdominal pain and change in bowel habits Genitourinary:negative Integument/breast: negative for nipple discharge, + bilateral sore nipples Musculoskeletal:negative for myalgias Neurological: negative for gait problems and tremors Behavioral/Psych: negative for abusive relationship, depression Endocrine: negative for temperature intolerance     Blood pressure 120/76, pulse 56, temperature 98.3 F (36.8 C), weight 238 lb (107.956 kg).  Physical Exam Physical Exam General:   alert  Skin:  no rash or abnormalities  Lungs:   clear to auscultation bilaterally  Heart:   regular rate and rhythm, S1, S2 normal, no murmur, click, rub or gallop  Breasts:   normal without suspicious masses, skin or nipple changes or axillary nodes  Abdomen:  normal findings: no organomegaly, soft, non-tender and no hernia  Pelvis:  deferred    50% of 15 min visit spent on counseling and coordination of care.   Data Reviewed Previous medical hx, labs, meds, mammograms  Assessment     Bilateral nipple tenderness     Plan    Orders Placed This Encounter  Procedures  . MM DIAG BREAST TOMO BILATERAL    CO-SIGN REQ  06/20/15 PF: 06/13/2014 BCG NO NEEDS  CR/BARB  [employer] MEDICARE   bilateral sore nipples/breasts new onset X1.5 week    Standing Status: Future     Number of Occurrences:      Standing Expiration Date: 08/19/2016    Order Specific Question:  Reason for Exam (SYMPTOM  OR DIAGNOSIS REQUIRED)    Answer:  bilateral sore nipples/breasts new onset X1.5 week    Order Specific Question:  Preferred imaging location?    Answer:  [facility]   Meds ordered this encounter  Medications  . potassium chloride (K-DUR) 10 MEQ tablet    Sig: Take 10 mEq by mouth daily.     Follow up in October for annual exam.

## 2015-06-27 ENCOUNTER — Ambulatory Visit
Admission: RE | Admit: 2015-06-27 | Discharge: 2015-06-27 | Disposition: A | Discharge status: Home / Self Care | Payer: Medicare HMO | Source: Ambulatory Visit | Attending: Certified Nurse Midwife | Admitting: Certified Nurse Midwife

## 2015-06-27 DIAGNOSIS — N644 Mastodynia: Secondary | ICD-10-CM

## 2015-09-11 ENCOUNTER — Emergency Department (HOSPITAL_COMMUNITY)
Admission: EM | Admit: 2015-09-11 | Discharge: 2015-09-11 | Disposition: A | Discharge status: Home / Self Care | Payer: Medicare HMO | Attending: Emergency Medicine | Admitting: Emergency Medicine

## 2015-09-11 ENCOUNTER — Encounter (HOSPITAL_COMMUNITY): Payer: Self-pay | Admitting: *Deleted

## 2015-09-11 ENCOUNTER — Emergency Department (HOSPITAL_COMMUNITY): Payer: Medicare HMO

## 2015-09-11 DIAGNOSIS — Z7982 Long term (current) use of aspirin: Secondary | ICD-10-CM | POA: Diagnosis not present

## 2015-09-11 DIAGNOSIS — Z79899 Other long term (current) drug therapy: Secondary | ICD-10-CM | POA: Diagnosis not present

## 2015-09-11 DIAGNOSIS — S199XXA Unspecified injury of neck, initial encounter: Secondary | ICD-10-CM | POA: Insufficient documentation

## 2015-09-11 DIAGNOSIS — I10 Essential (primary) hypertension: Secondary | ICD-10-CM | POA: Insufficient documentation

## 2015-09-11 DIAGNOSIS — J029 Acute pharyngitis, unspecified: Secondary | ICD-10-CM | POA: Insufficient documentation

## 2015-09-11 DIAGNOSIS — Y9389 Activity, other specified: Secondary | ICD-10-CM | POA: Diagnosis not present

## 2015-09-11 DIAGNOSIS — Z880 Allergy status to penicillin: Secondary | ICD-10-CM | POA: Insufficient documentation

## 2015-09-11 DIAGNOSIS — Z87891 Personal history of nicotine dependence: Secondary | ICD-10-CM | POA: Insufficient documentation

## 2015-09-11 DIAGNOSIS — S52514A Nondisplaced fracture of right radial styloid process, initial encounter for closed fracture: Secondary | ICD-10-CM | POA: Insufficient documentation

## 2015-09-11 DIAGNOSIS — S52512A Displaced fracture of left radial styloid process, initial encounter for closed fracture: Secondary | ICD-10-CM

## 2015-09-11 DIAGNOSIS — Y92410 Unspecified street and highway as the place of occurrence of the external cause: Secondary | ICD-10-CM | POA: Insufficient documentation

## 2015-09-11 DIAGNOSIS — Z7902 Long term (current) use of antithrombotics/antiplatelets: Secondary | ICD-10-CM | POA: Insufficient documentation

## 2015-09-11 DIAGNOSIS — M542 Cervicalgia: Secondary | ICD-10-CM

## 2015-09-11 DIAGNOSIS — Y998 Other external cause status: Secondary | ICD-10-CM | POA: Insufficient documentation

## 2015-09-11 DIAGNOSIS — Z8639 Personal history of other endocrine, nutritional and metabolic disease: Secondary | ICD-10-CM | POA: Insufficient documentation

## 2015-09-11 DIAGNOSIS — S6992XA Unspecified injury of left wrist, hand and finger(s), initial encounter: Secondary | ICD-10-CM | POA: Diagnosis present

## 2015-09-11 LAB — I-STAT CHEM 8, ED
BUN: 14 mg/dL (ref 6–20)
CALCIUM ION: 1.21 mmol/L (ref 1.13–1.30)
CREATININE: 0.6 mg/dL (ref 0.44–1.00)
Chloride: 106 mmol/L (ref 101–111)
GLUCOSE: 83 mg/dL (ref 65–99)
HCT: 48 % — ABNORMAL HIGH (ref 36.0–46.0)
Hemoglobin: 16.3 g/dL — ABNORMAL HIGH (ref 12.0–15.0)
Potassium: 4.1 mmol/L (ref 3.5–5.1)
SODIUM: 145 mmol/L (ref 135–145)
TCO2: 27 mmol/L (ref 0–100)

## 2015-09-11 MED ORDER — HYDROCODONE-ACETAMINOPHEN 5-325 MG PO TABS: 1.0000 | ORAL_TABLET | Freq: Four times a day (QID) | ORAL | Status: DC | PRN

## 2015-09-11 MED ORDER — IOHEXOL 350 MG/ML SOLN
80.0000 mL | Freq: Once | INTRAVENOUS | Status: AC | PRN
  Administered 2015-09-11: 100 mL via INTRAVENOUS

## 2015-09-11 MED ORDER — OXYCODONE-ACETAMINOPHEN 5-325 MG PO TABS
2.0000 | ORAL_TABLET | Freq: Once | ORAL | Status: AC
  Administered 2015-09-11: 2 via ORAL
  Filled 2015-09-11: qty 2

## 2015-09-11 MED ORDER — METHOCARBAMOL 500 MG PO TABS
500.0000 mg | ORAL_TABLET | Freq: Two times a day (BID) | ORAL | Status: DC
Start: 2015-09-11 — End: 2017-06-05

## 2015-09-11 MED ORDER — HYDROCODONE-ACETAMINOPHEN 5-325 MG PO TABS
1.0000 | ORAL_TABLET | Freq: Once | ORAL | Status: AC
  Administered 2015-09-11: 1 via ORAL
  Filled 2015-09-11: qty 1
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
## 2016-08-06 ENCOUNTER — Encounter (INDEPENDENT_AMBULATORY_CARE_PROVIDER_SITE_OTHER): Payer: Self-pay | Admitting: Orthopedic Surgery

## 2016-08-06 ENCOUNTER — Ambulatory Visit (INDEPENDENT_AMBULATORY_CARE_PROVIDER_SITE_OTHER): Payer: Medicare HMO

## 2016-08-06 ENCOUNTER — Ambulatory Visit (INDEPENDENT_AMBULATORY_CARE_PROVIDER_SITE_OTHER): Payer: Medicare HMO | Admitting: Orthopedic Surgery

## 2016-08-06 VITALS — BP 156/79 | HR 73 | Resp 18 | Ht 64.0 in | Wt 246.0 lb

## 2016-08-06 DIAGNOSIS — M79671 Pain in right foot: Secondary | ICD-10-CM

## 2016-08-06 DIAGNOSIS — M19071 Primary osteoarthritis, right ankle and foot: Secondary | ICD-10-CM | POA: Diagnosis not present

## 2016-08-06 MED ORDER — DICLOFENAC SODIUM 1 % TD GEL: 2.0000 g | Freq: Four times a day (QID) | TRANSDERMAL | 1 refills | Status: DC

## 2016-08-06 NOTE — Progress Notes (Signed)
Office Visit Note   Patient: Felicia Petersen           Date of Birth: [birth year]           MRN: [id number] Visit Date: 08/06/2016              Requested by: Donia GuilesJerome Spruill, MD [address] [place] [id number] PCP: Pola CornSPRUILL,JEROME O, MD   Assessment & Plan: Visit Diagnoses:  1. Pain in right foot     Plan:  #1: Equalizer boot with an arch support  #2: Voltaren gel if approved by her cardiologist #3: Follow back up in 2 weeks for recheck evaluation.  Follow-Up Instructions: No Follow-up on file.   Orders:  Orders Placed This Encounter  Procedures  . XR Foot Complete Right  . XR Ankle Complete Right   No orders of the defined types were placed in this encounter.     Procedures: No procedures performed   Clinical Data: No additional findings.   Subjective: Chief Complaint  Patient presents with  . Right Foot - Pain    Felicia Petersen is 65 year old African-American female who is seen today for evaluation of her right foot. Having problems with the right foot for several months in duration more in the midfoot and into the anterior ankle. Her last one week's time symptoms worsen to the point where she is limping profusely. She does not remember any history of injury or trauma to this area but scarred to the point now where she can barely walk on the right foot without having pain and discomfort. She states she is had some problems in the past with this but nothing to the degree that she has now. Denies any neurovascular compromise.    Review of Systems  Constitutional: Negative.   HENT: Negative.   Respiratory: Positive for apnea (sleep apnea).   Cardiovascular:       Hypertension  Gastrointestinal: Negative.   Genitourinary: Negative.   Skin: Negative.   Neurological: Negative.   Hematological: Negative.   Psychiatric/Behavioral: Negative.      Objective: Vital Signs: BP (!) 156/79   Pulse 73   Resp 18   Ht 5\' 4"  (1.626 m)   Wt 246 lb (111.6 kg)    BMI 42.23 kg/m   Physical Exam  Constitutional: She is oriented to person, place, and time. She appears well-developed and well-nourished.  HENT:  Head: Normocephalic and atraumatic.  Eyes: EOM are normal. Pupils are equal, round, and reactive to light.  Neck:  No carotid bruits  Pulmonary/Chest: Effort normal.  Neurological: She is alert and oriented to person, place, and time.  Skin: Skin is warm and dry.  Psychiatric: She has a normal mood and affect. Her behavior is normal. Judgment and thought content normal.    Ortho Exam  Exam today of the right foot reveals tenderness to palpation across the dorsum of her foot at the midfoot as well as over the tip talar joint. When she stands she has lost her completely. She does have some tenderness over the posterior tibial tendon. Intact distally. Skin is intact.  Specialty Comments:  No specialty comments available.  Imaging: No results found.   PMFS History: There are no active problems to display for this patient.  Past Medical History:  Diagnosis Date  . Hyperlipidemia   . Hypertension     Family History  Problem Relation Age of Onset  . Cancer Mother   . Heart disease Father   . Cancer Sister   .  Heart disease Sister   . Diabetes Brother     Past Surgical History:  Procedure Laterality Date  . BYPASS GRAFT     triple bypass  . REPLACEMENT TOTAL KNEE Left    Social History   Occupational History  . Not on file.   Social History Main Topics  . Smoking status: Former Games developermoker  . Smokeless tobacco: Not on file  . Alcohol use No  . Drug use: No  . Sexual activity: No

## 2016-08-20 ENCOUNTER — Ambulatory Visit (INDEPENDENT_AMBULATORY_CARE_PROVIDER_SITE_OTHER): Payer: Medicare HMO | Admitting: Orthopedic Surgery

## 2016-11-19 ENCOUNTER — Other Ambulatory Visit: Payer: Self-pay | Admitting: Obstetrics and Gynecology

## 2016-11-19 DIAGNOSIS — Z1231 Encounter for screening mammogram for malignant neoplasm of breast: Secondary | ICD-10-CM

## 2016-12-02 ENCOUNTER — Ambulatory Visit: Payer: Medicare HMO

## 2016-12-08 ENCOUNTER — Other Ambulatory Visit (HOSPITAL_COMMUNITY)
Admission: RE | Admit: 2016-12-08 | Discharge: 2016-12-08 | Disposition: A | Discharge status: Home / Self Care | Payer: Medicare HMO | Source: Ambulatory Visit | Attending: Obstetrics and Gynecology | Admitting: Obstetrics and Gynecology

## 2016-12-08 ENCOUNTER — Encounter: Payer: Self-pay | Admitting: Obstetrics and Gynecology

## 2016-12-08 ENCOUNTER — Ambulatory Visit (INDEPENDENT_AMBULATORY_CARE_PROVIDER_SITE_OTHER): Payer: Medicare HMO | Admitting: Obstetrics and Gynecology

## 2016-12-08 VITALS — BP 149/84 | HR 61 | Temp 97.0°F | Wt 249.4 lb

## 2016-12-08 DIAGNOSIS — Z01419 Encounter for gynecological examination (general) (routine) without abnormal findings: Secondary | ICD-10-CM

## 2016-12-08 DIAGNOSIS — Z0000 Encounter for general adult medical examination without abnormal findings: Secondary | ICD-10-CM

## 2016-12-08 DIAGNOSIS — Z01411 Encounter for gynecological examination (general) (routine) with abnormal findings: Secondary | ICD-10-CM | POA: Insufficient documentation

## 2016-12-08 NOTE — Addendum Note (Signed)
Addended by: Maretta BeesMCGLASHAN, Annis Lagoy J on: 12/08/2016 09:32 AM   Modules accepted: Orders

## 2016-12-08 NOTE — Progress Notes (Signed)
Subjective:     Tyrone SageBrenda D Bayon is a 66 y.o. female postmenopausal with BMI 42 who is here for a comprehensive physical exam. The patient reports no problems. Patient denies any vaginal bleeding. She denies urinary incontinence. She is not sexually active. Patient denies abdominal/pelvic pain or abnormal discharge.  Past Medical History:  Diagnosis Date  . Hyperlipidemia   . Hypertension    Past Surgical History:  Procedure Laterality Date  . BYPASS GRAFT     triple bypass  . REPLACEMENT TOTAL KNEE Left    Family History  Problem Relation Age of Onset  . Cancer Mother   . Heart disease Father   . Cancer Sister   . Heart disease Sister   . Diabetes Brother    Social History   Social History  . Marital status: Single    Spouse name: N/A  . Number of children: N/A  . Years of education: N/A   Occupational History  . Not on file.   Social History Main Topics  . Smoking status: Former Games developermoker  . Smokeless tobacco: Never Used  . Alcohol use No  . Drug use: No  . Sexual activity: No   Other Topics Concern  . Not on file   Social History Narrative  . No narrative on file   Health Maintenance  Topic Date Due  . Hepatitis C Screening  08-31-1951  . TETANUS/TDAP  11/02/1969  . COLONOSCOPY  11/02/2000  . DEXA SCAN  11/03/2015  . PNA vac Low Risk Adult (1 of 2 - PCV13) 11/03/2015  . INFLUENZA VACCINE  04/22/2016  . MAMMOGRAM  06/26/2017       Review of Systems Pertinent items are noted in HPI.   Objective:  Blood pressure (!) 149/84, pulse 61, temperature 97 F (36.1 C), weight 249 lb 6.4 oz (113.1 kg).     GENERAL: Well-developed, well-nourished female in no acute distress.  HEENT: Normocephalic, atraumatic. Sclerae anicteric.  NECK: Supple. Normal thyroid.  LUNGS: Clear to auscultation bilaterally.  HEART: Regular rate and rhythm. BREASTS: Symmetric in size. No palpable masses or lymphadenopathy, skin changes, or nipple drainage. ABDOMEN: Soft, nontender,  nondistended. No organomegaly. PELVIC: Normal external female genitalia. Vagina is pink and rugated.  Normal discharge. Normal appearing cervix. Uterus is normal in size. No adnexal mass or tenderness. EXTREMITIES: No cyanosis, clubbing, or edema, 2+ distal pulses.    Assessment:    Healthy female exam.      Plan:    Pap smear collected. Patient was informed that pap smears are no longer required next year as long as this one is normal.  Patient scheduled for screening mammogram next week Patient plans to have annual labs with PCP next week Patient will be contacted with any abnormal results Patient encouraged to continue monthly breast and vulva exams RTC in 1 year for annual exam See After Visit Summary for Counseling Recommendations

## 2016-12-11 LAB — CYTOLOGY - PAP
Adequacy: ABSENT
Diagnosis: NEGATIVE

## 2016-12-17 ENCOUNTER — Ambulatory Visit
Admission: RE | Admit: 2016-12-17 | Discharge: 2016-12-17 | Disposition: A | Discharge status: Home / Self Care | Payer: Medicare HMO | Source: Ambulatory Visit | Attending: Obstetrics and Gynecology | Admitting: Obstetrics and Gynecology

## 2016-12-17 DIAGNOSIS — Z1231 Encounter for screening mammogram for malignant neoplasm of breast: Secondary | ICD-10-CM

## 2017-01-26 ENCOUNTER — Telehealth: Payer: Self-pay | Admitting: Interventional Cardiology

## 2017-01-26 NOTE — Telephone Encounter (Signed)
Patient states she saw Dr. Katrinka BlazingSmith about 5 years ago--she had open heart surgery 17 years ago----she has had some shortness of breath over the past several weeks (nothing serious) and she wants to speak with someone.

## 2017-01-26 NOTE — Telephone Encounter (Signed)
F/U call: Patient states that she is returning a missed call to our office. Thanks.

## 2017-01-26 NOTE — Telephone Encounter (Signed)
Called patient and advised her to call her PCP-------she has only seen Spruill and Harwarni---she still states she saw Dr. Katrinka BlazingSmith 5 years go.

## 2017-03-26 DIAGNOSIS — I2510 Atherosclerotic heart disease of native coronary artery without angina pectoris: Secondary | ICD-10-CM | POA: Diagnosis not present

## 2017-03-26 DIAGNOSIS — G4733 Obstructive sleep apnea (adult) (pediatric): Secondary | ICD-10-CM | POA: Diagnosis not present

## 2017-03-26 DIAGNOSIS — I10 Essential (primary) hypertension: Secondary | ICD-10-CM | POA: Diagnosis not present

## 2017-03-26 DIAGNOSIS — E785 Hyperlipidemia, unspecified: Secondary | ICD-10-CM | POA: Diagnosis not present

## 2017-03-26 DIAGNOSIS — M5432 Sciatica, left side: Secondary | ICD-10-CM | POA: Diagnosis not present

## 2017-03-26 DIAGNOSIS — R252 Cramp and spasm: Secondary | ICD-10-CM | POA: Diagnosis not present

## 2017-03-26 DIAGNOSIS — Z1389 Encounter for screening for other disorder: Secondary | ICD-10-CM | POA: Diagnosis not present

## 2017-05-04 DIAGNOSIS — G4733 Obstructive sleep apnea (adult) (pediatric): Secondary | ICD-10-CM | POA: Diagnosis not present

## 2017-05-15 ENCOUNTER — Encounter: Payer: Self-pay | Admitting: Interventional Cardiology

## 2017-06-04 DIAGNOSIS — I10 Essential (primary) hypertension: Secondary | ICD-10-CM | POA: Insufficient documentation

## 2017-06-04 DIAGNOSIS — E785 Hyperlipidemia, unspecified: Secondary | ICD-10-CM | POA: Insufficient documentation

## 2017-06-04 DIAGNOSIS — I25709 Atherosclerosis of coronary artery bypass graft(s), unspecified, with unspecified angina pectoris: Secondary | ICD-10-CM | POA: Insufficient documentation

## 2017-06-05 ENCOUNTER — Encounter: Payer: Self-pay | Admitting: Interventional Cardiology

## 2017-06-05 ENCOUNTER — Encounter (INDEPENDENT_AMBULATORY_CARE_PROVIDER_SITE_OTHER): Payer: Self-pay

## 2017-06-05 ENCOUNTER — Ambulatory Visit (INDEPENDENT_AMBULATORY_CARE_PROVIDER_SITE_OTHER): Payer: Medicare HMO | Admitting: Interventional Cardiology

## 2017-06-05 VITALS — BP 150/84 | HR 58 | Ht 62.0 in | Wt 248.8 lb

## 2017-06-05 DIAGNOSIS — I10 Essential (primary) hypertension: Secondary | ICD-10-CM | POA: Diagnosis not present

## 2017-06-05 DIAGNOSIS — R0789 Other chest pain: Secondary | ICD-10-CM | POA: Diagnosis not present

## 2017-06-05 DIAGNOSIS — R0602 Shortness of breath: Secondary | ICD-10-CM

## 2017-06-05 DIAGNOSIS — G4733 Obstructive sleep apnea (adult) (pediatric): Secondary | ICD-10-CM | POA: Insufficient documentation

## 2017-06-05 DIAGNOSIS — E785 Hyperlipidemia, unspecified: Secondary | ICD-10-CM

## 2017-06-05 DIAGNOSIS — I25709 Atherosclerosis of coronary artery bypass graft(s), unspecified, with unspecified angina pectoris: Secondary | ICD-10-CM

## 2017-06-05 NOTE — Patient Instructions (Signed)
Medication Instructions:  None  Labwork: Pro BNP today  Your physician recommends that you return for lab work the same day as your stress test (Lipid)   Testing/Procedures: Your physician has requested that you have a lexiscan myoview. For further information please visit [email or web address] Please follow instruction sheet, as given.   Follow-Up: Your physician wants you to follow-up in: 1 year with Dr. Katrinka Blazing.  You will receive a reminder letter in the mail two months in advance. If you don't receive a letter, please call our office to schedule the follow-up appointment.   Any Other Special Instructions Will Be Listed Below (If Applicable).     If you need a refill on your cardiac medications before your next appointment, please call your pharmacy.

## 2017-06-05 NOTE — Progress Notes (Signed)
Cardiology Office Note    Date:  06/05/2017   ID:  LACONYA Petersen, DOB 1951/02/16, MRN [id number]  PCP:  Felicia Montana, MD  Cardiologist: Felicia Noe, MD   Chief Complaint  Patient presents with  . Coronary Artery Disease    History of Present Illness:  Felicia Petersen is a 66 y.o. female referred by Dr. Laurann Petersen for evaluation of chest pain and dyspnea. The patient has a history of prior bypass grafting in 2000 with LIMA to LAD, SVG to ramus, and SVG to OM. Additional significant medical history includes hyperlipidemia, hypertension, and obstructive sleep apnea.  Felicia Petersen is a pleasant female who is referred for evaluation of recurrent chest discomfort. She characterizes the discomfort as a pinching-type discomfort in the left subclavicular region. Episodes of discomfort occurs spontaneously at random. They are not predictable or exertion related. No episodes lasted longer than seconds. There is no associated dyspnea, diaphoresis, or radiation. It is dissimilar to prior ischemic cardiac complaints in 2000 before bypass grafting. Symptoms at that time included chest pressure and dyspnea with diaphoresis.  She has had dyspnea. This seems to occur randomly. Sometimes it is present with exertion and other times not. She denies orthopnea but if she awakens in the middle of the night and moves about she feels short of breath.  Past Medical History:  Diagnosis Date  . Hyperlipidemia   . Hypertension     Past Surgical History:  Procedure Laterality Date  . BYPASS GRAFT     triple bypass  . REPLACEMENT TOTAL KNEE Left     Current Medications: Outpatient Medications Prior to Visit  Medication Sig Dispense Refill  . amLODipine (NORVASC) 10 MG tablet Take 10 mg by mouth daily.    Marland Kitchen aspirin 81 MG tablet Take 81 mg by mouth daily.    Marland Kitchen atenolol (TENORMIN) 50 MG tablet Take 50 mg by mouth daily.    . Cholecalciferol (VITAMIN D-3) 1000 UNITS CAPS Take by mouth.    .  clopidogrel (PLAVIX) 75 MG tablet Take 75 mg by mouth daily with breakfast.    . furosemide (LASIX) 40 MG tablet Take 40 mg by mouth.    . Multiple Vitamin (MULTIVITAMIN) tablet Take 1 tablet by mouth daily.    . potassium chloride (K-DUR) 10 MEQ tablet Take 10 mEq by mouth daily.    . cyclobenzaprine (FLEXERIL) 5 MG tablet Take 1 tablet (5 mg total) by mouth 3 (three) times daily as needed for muscle spasms. (Patient not taking: Reported on 08/06/2016) 15 tablet 0  . cycloSPORINE (RESTASIS) 0.05 % ophthalmic emulsion 1 drop 2 (two) times daily.    . diclofenac sodium (VOLTAREN) 1 % GEL Apply 2 g topically 4 (four) times daily. 3 Tube 1  . HYDROcodone-acetaminophen (NORCO/VICODIN) 5-325 MG tablet Take 1 tablet by mouth every 6 (six) hours as needed. (Patient not taking: Reported on 08/06/2016) 8 tablet 0  . methocarbamol (ROBAXIN) 500 MG tablet Take 1 tablet (500 mg total) by mouth 2 (two) times daily. (Patient not taking: Reported on 08/06/2016) 20 tablet 0  . predniSONE (DELTASONE) 50 MG tablet Take 1 tablet (50 mg total) by mouth daily. (Patient not taking: Reported on 08/06/2016) 5 tablet 0  . valsartan (DIOVAN) 320 MG tablet Take 320 mg by mouth daily.    Marland Kitchen VYTORIN 10-20 MG tablet 1 Dose.     No facility-administered medications prior to visit.      Allergies:   Fish allergy and Penicillins  Social History   Social History  . Marital status: Single    Spouse name: N/A  . Number of children: N/A  . Years of education: N/A   Social History Main Topics  . Smoking status: Former Games developer  . Smokeless tobacco: Never Used  . Alcohol use No  . Drug use: No  . Sexual activity: No   Other Topics Concern  . None   Social History Narrative  . None     Family History:  The patient's family history includes Breast cancer in her maternal aunt; Cancer in her mother and sister; Diabetes in her brother; Heart disease in her father and sister.   ROS:   Please see the history of present  illness.    History of sleep apnea, snoring, anxiety, right knee arthritis that prevents regular walking for exercise. Does go to the [facility] and gets stationary bicycle  All other systems reviewed and are negative.   PHYSICAL EXAM:   VS:  BP (!) 150/84 (BP Location: Left Arm)   Pulse (!) 58   Ht  (1.575 m)   Wt 248 lb 12.8 oz (112.9 kg)   BMI 45.51 kg/m    GEN: Well nourished, well developed, in no acute distress  HEENT: normal  Neck: no JVD, carotid bruits, or masses Cardiac: RRR; no murmurs, rubs, or gallops,no edema  Respiratory:  clear to auscultation bilaterally, normal work of breathing GI: soft, nontender, nondistended, + BS MS: no deformity or atrophy  Skin: warm and dry, no rash Neuro:  Alert and Oriented x 3, Strength and sensation are intact Psych: euthymic mood, full affect  Wt Readings from Last 3 Encounters:  06/05/17 248 lb 12.8 oz (112.9 kg)  12/08/16 249 lb 6.4 oz (113.1 kg)  08/06/16 246 lb (111.6 kg)      Studies/Labs Reviewed:   EKG:  EKG  NSR and SB and otherwise normal.  Recent Labs: No results found for requested labs within last 8760 hours.   Lipid Panel No results found for: CHOL, TRIG, HDL, CHOLHDL, VLDL, LDLCALC, LDLDIRECT  Additional studies/ records that were reviewed today include:  Cardiac catheterization in 2008 demonstrated patency of the LIMA to LAD, SVG to OM, and SVG to ramus.  ASSESSMENT:    1. Chest discomfort   2. SOB (shortness of breath)   3. Coronary artery disease involving coronary bypass graft of native heart with angina pectoris (HCC)   4. Hyperlipidemia LDL goal <70   5. Essential hypertension   6. Obstructive sleep apnea      PLAN:  In order of problems listed above:  1. Atypical in nature, being short lived and described as pinching. Not predictable and onset. There is no exertional component. Lexiscan myocardial perfusion imaging will be performed to exclude atypical presentation of myocardial ischemia in  this patient with prior coronary bypass grafting 18 years ago. 2. Dyspnea is more concerning. It is occurring at random. She had dyspnea associated with ischemia prior to bypass grafting. Myocardial perfusion imaging would help determine if there is evidence of myocardial ischemia which could make the dyspnea and anginal equivalent. A BNP is also being done to exclude volume overload/CHF. 3. Prior history of bypass grafting as noted above with grafts to the LAD, ramus intermedius, and obtuse marginal. Documented patency in 2008 by angiography. 4. Target should be less than 70 LDL. 5. Target blood pressure 130/90 mmHg or less. 6. Continue C Pap management including updating current equipment to allow effective management of hypoxemia.  Overall, seems to be doing relatively well. Her grafts are aged. We will evaluate for evidence of graft closure, systolic dysfunction, and diastolic heart failure. May need an neck: Cardiac gram performed if BNP is elevated and systolic function is normal on the perfusion imaging. Clinical follow-up in one year and going forward given the age of her bypass grafts.   Medication Adjustments/Labs and Tests Ordered: Current medicines are reviewed at length with the patient today.  Concerns regarding medicines are outlined above.  Medication changes, Labs and Tests ordered today are listed in the Patient Instructions below. Patient Instructions  Medication Instructions:  None  Labwork: Pro BNP today  Your physician recommends that you return for lab work the same day as your stress test (Lipid)   Testing/Procedures: Your physician has requested that you have a lexiscan myoview. For further information please visit [email or web address] Please follow instruction sheet, as given.   Follow-Up: Your physician wants you to follow-up in: 1 year with Dr. Katrinka Blazing.  You will receive a reminder letter in the mail two months in advance. If you don't receive a letter, please  call our office to schedule the follow-up appointment.   Any Other Special Instructions Will Be Listed Below (If Applicable).     If you need a refill on your cardiac medications before your next appointment, please call your pharmacy.      Signed, Felicia Noe, MD  06/05/2017 10:16 AM    [facility] [address] [place], [place], [place]  [address] Phone: [phone]; Fax: [phone]

## 2017-06-06 LAB — PRO B NATRIURETIC PEPTIDE: NT-PRO BNP: 154 pg/mL (ref 0–301)

## 2017-06-06 LAB — LIPID PANEL
Chol/HDL Ratio: 4.5 ratio — ABNORMAL HIGH (ref 0.0–4.4)
Cholesterol, Total: 194 mg/dL (ref 100–199)
HDL: 43 mg/dL (ref >39)
LDL Calculated: 132 mg/dL — ABNORMAL HIGH (ref 0–99)
Triglycerides: 96 mg/dL (ref 0–149)
VLDL CHOLESTEROL CAL: 19 mg/dL (ref 5–40)

## 2017-06-08 ENCOUNTER — Telehealth: Payer: Self-pay | Admitting: *Deleted

## 2017-06-08 ENCOUNTER — Telehealth (HOSPITAL_COMMUNITY): Payer: Self-pay | Admitting: *Deleted

## 2017-06-08 DIAGNOSIS — E785 Hyperlipidemia, unspecified: Secondary | ICD-10-CM

## 2017-06-08 MED ORDER — ROSUVASTATIN CALCIUM 40 MG PO TABS: 40.0000 mg | ORAL_TABLET | Freq: Every day | ORAL | 3 refills | Status: DC

## 2017-06-08 NOTE — Telephone Encounter (Signed)
.  Patient given detailed instructions per Myocardial Perfusion Study Information Sheet for the test on 09/10/17. Patient notified to arrive 15 minutes early and that it is imperative to arrive on time for appointment to keep from having the test rescheduled.  If you need to cancel or reschedule your appointment, please call the office within 24 hours of your appointment. . Patient verbalized understanding. Ricky Ala

## 2017-06-08 NOTE — Telephone Encounter (Signed)
-----   Message from Lyn Records, MD sent at 06/05/2017  5:27 PM EDT ----- Let the patient know the LDL is 132 which is 62 points greater than target. We should change to rosuvastatin 40 mg daily from Vytorin. Repeat liver and lipid panel in 6-8 weeks. A copy will be sent to Laurann Montana, MD

## 2017-06-08 NOTE — Telephone Encounter (Signed)
Pt has been notified of lab results by phone with verbal understanding. Pt advised per Dr. Katrinka Blazing to d/c Vytorin and change over to Crestor 40 mg daily; New Rx was sent in for Crestor 40 mg to [facility]. Repeat FLP/LFT 07/20/17. Pt thanked me for my call today.   Also pt is scheduled to come in Thursday 9/20 for a Myoview and was supposed to have Lipids done then. Will cancel the Lipid panel since done already.

## 2017-06-11 ENCOUNTER — Ambulatory Visit (HOSPITAL_COMMUNITY): Payer: Medicare HMO | Attending: Cardiology

## 2017-06-11 ENCOUNTER — Other Ambulatory Visit: Payer: Medicare HMO

## 2017-06-11 DIAGNOSIS — E782 Mixed hyperlipidemia: Secondary | ICD-10-CM | POA: Insufficient documentation

## 2017-06-11 DIAGNOSIS — R0789 Other chest pain: Secondary | ICD-10-CM | POA: Insufficient documentation

## 2017-06-11 DIAGNOSIS — I2510 Atherosclerotic heart disease of native coronary artery without angina pectoris: Secondary | ICD-10-CM | POA: Insufficient documentation

## 2017-06-11 DIAGNOSIS — I10 Essential (primary) hypertension: Secondary | ICD-10-CM | POA: Diagnosis not present

## 2017-06-11 MED ORDER — REGADENOSON 0.4 MG/5ML IV SOLN
0.4000 mg | Freq: Once | INTRAVENOUS | Status: AC
  Administered 2017-06-11: 0.4 mg via INTRAVENOUS

## 2017-06-11 MED ORDER — TECHNETIUM TC 99M TETROFOSMIN IV KIT
33.0000 | PACK | Freq: Once | INTRAVENOUS | Status: AC | PRN
  Administered 2017-06-11: 33 via INTRAVENOUS
  Filled 2017-06-11: qty 33

## 2017-06-12 ENCOUNTER — Ambulatory Visit (HOSPITAL_COMMUNITY): Payer: Medicare HMO | Attending: Cardiology

## 2017-06-12 LAB — MYOCARDIAL PERFUSION IMAGING
CHL CUP NUCLEAR SRS: 0
LV dias vol: 148 mL (ref 46–106)
LV sys vol: 69 mL
NUC STRESS TID: 0.94
Peak HR: 68 {beats}/min
RATE: 0.26
Rest HR: 54 {beats}/min
SDS: 0
SSS: 0

## 2017-06-12 MED ORDER — TECHNETIUM TC 99M TETROFOSMIN IV KIT
32.3000 | PACK | Freq: Once | INTRAVENOUS | Status: AC | PRN
  Administered 2017-06-12: 32.3 via INTRAVENOUS
  Filled 2017-06-12: qty 33

## 2017-06-24 DIAGNOSIS — G4733 Obstructive sleep apnea (adult) (pediatric): Secondary | ICD-10-CM | POA: Diagnosis not present

## 2017-06-29 DIAGNOSIS — E785 Hyperlipidemia, unspecified: Secondary | ICD-10-CM | POA: Diagnosis not present

## 2017-06-29 DIAGNOSIS — Z23 Encounter for immunization: Secondary | ICD-10-CM | POA: Diagnosis not present

## 2017-06-29 DIAGNOSIS — M5432 Sciatica, left side: Secondary | ICD-10-CM | POA: Diagnosis not present

## 2017-06-29 DIAGNOSIS — I10 Essential (primary) hypertension: Secondary | ICD-10-CM | POA: Diagnosis not present

## 2017-07-20 ENCOUNTER — Other Ambulatory Visit: Payer: Medicare HMO

## 2017-07-20 ENCOUNTER — Encounter (INDEPENDENT_AMBULATORY_CARE_PROVIDER_SITE_OTHER): Payer: Self-pay

## 2017-07-20 ENCOUNTER — Telehealth: Payer: Self-pay | Admitting: Interventional Cardiology

## 2017-07-20 DIAGNOSIS — E785 Hyperlipidemia, unspecified: Secondary | ICD-10-CM

## 2017-07-20 DIAGNOSIS — H1013 Acute atopic conjunctivitis, bilateral: Secondary | ICD-10-CM | POA: Diagnosis not present

## 2017-07-20 DIAGNOSIS — H04213 Epiphora due to excess lacrimation, bilateral lacrimal glands: Secondary | ICD-10-CM | POA: Diagnosis not present

## 2017-07-20 DIAGNOSIS — H40013 Open angle with borderline findings, low risk, bilateral: Secondary | ICD-10-CM | POA: Diagnosis not present

## 2017-07-20 DIAGNOSIS — H04123 Dry eye syndrome of bilateral lacrimal glands: Secondary | ICD-10-CM | POA: Diagnosis not present

## 2017-07-20 LAB — HEPATIC FUNCTION PANEL
ALT: 17 IU/L (ref 0–32)
AST: 22 IU/L (ref 0–40)
Albumin: 4.2 g/dL (ref 3.6–4.8)
Alkaline Phosphatase: 127 IU/L — ABNORMAL HIGH (ref 39–117)
BILIRUBIN TOTAL: 0.5 mg/dL (ref 0.0–1.2)
BILIRUBIN, DIRECT: 0.15 mg/dL (ref 0.00–0.40)
TOTAL PROTEIN: 6.4 g/dL (ref 6.0–8.5)

## 2017-07-20 LAB — LIPID PANEL
CHOL/HDL RATIO: 3.2 ratio (ref 0.0–4.4)
CHOLESTEROL TOTAL: 133 mg/dL (ref 100–199)
HDL: 42 mg/dL (ref >39)
LDL CALC: 75 mg/dL (ref 0–99)
Triglycerides: 79 mg/dL (ref 0–149)
VLDL Cholesterol Cal: 16 mg/dL (ref 5–40)

## 2017-07-20 NOTE — Telephone Encounter (Signed)
Pt in today for Lipid and liver panel.  Pt wanted to speak to me about changing her Crestor.  Pt states since starting it she has had constipation and wakes up during the night with a HA.  Pt asked her RPH and they told her these are both side effects of the Rosuvastatin.  Denies any other recent med changes.  Advised I will send message to Dr. Katrinka BlazingSmith for review and advisement.

## 2017-07-20 NOTE — Telephone Encounter (Signed)
New message    Pt came in today and asked that the nurse give her a call about her cholesterol meds. She did not say if it was a problem, would like to speak with RN.

## 2017-07-21 NOTE — Telephone Encounter (Signed)
Did we start rosuvastatin 40 mg/day in September when I saw her?  Stop the medication for 2 weeks and resume taking 20 mg/day thereafter.  Report symptoms if they recur.

## 2017-07-23 MED ORDER — ROSUVASTATIN CALCIUM 20 MG PO TABS: 20.0000 mg | ORAL_TABLET | Freq: Every day | ORAL | 3 refills | Status: DC

## 2017-07-23 NOTE — Addendum Note (Signed)
Addended by: Burnetta SabinWITTY, Haset [place] K on: 07/23/2017 03:09 PM   Modules accepted: Orders

## 2017-07-23 NOTE — Telephone Encounter (Signed)
-----   Message from Lyn RecordsHenry W Smith, MD sent at 07/22/2017  8:34 PM EDT ----- Let the patient know the Lipids are at target. A copy will be sent to Laurann MontanaWhite, Cynthia, MD

## 2017-07-23 NOTE — Telephone Encounter (Signed)
Pt has also been made aware that per Dr. Katrinka BlazingSmith, to stop the Crestor for 2 weeks then resume at 20 mg daily and to let us know if her symptoms recur.  Pt is agreeable to the plan and verbalized understanding.

## 2017-07-23 NOTE — Telephone Encounter (Signed)
Follow Up:; ° ° °Returning your call. °

## 2017-07-23 NOTE — Telephone Encounter (Signed)
Notes recorded by Elliot CousinWitty, Kamar Callender, RMA on 07/23/2017 at 10:49 AM EDT Lmptcb jw 07/23/17

## 2017-08-10 DIAGNOSIS — G4733 Obstructive sleep apnea (adult) (pediatric): Secondary | ICD-10-CM | POA: Diagnosis not present

## 2017-10-07 DIAGNOSIS — I2510 Atherosclerotic heart disease of native coronary artery without angina pectoris: Secondary | ICD-10-CM | POA: Diagnosis not present

## 2017-10-07 DIAGNOSIS — F5101 Primary insomnia: Secondary | ICD-10-CM | POA: Diagnosis not present

## 2017-10-07 DIAGNOSIS — I10 Essential (primary) hypertension: Secondary | ICD-10-CM | POA: Diagnosis not present

## 2017-10-07 DIAGNOSIS — Z6841 Body Mass Index (BMI) 40.0 and over, adult: Secondary | ICD-10-CM | POA: Diagnosis not present

## 2017-10-07 DIAGNOSIS — E785 Hyperlipidemia, unspecified: Secondary | ICD-10-CM | POA: Diagnosis not present

## 2017-10-07 DIAGNOSIS — F439 Reaction to severe stress, unspecified: Secondary | ICD-10-CM | POA: Diagnosis not present

## 2017-11-05 DIAGNOSIS — G4733 Obstructive sleep apnea (adult) (pediatric): Secondary | ICD-10-CM | POA: Diagnosis not present

## 2017-11-11 DIAGNOSIS — F5101 Primary insomnia: Secondary | ICD-10-CM | POA: Diagnosis not present

## 2018-01-06 DIAGNOSIS — F5101 Primary insomnia: Secondary | ICD-10-CM | POA: Diagnosis not present

## 2018-01-15 DIAGNOSIS — H40013 Open angle with borderline findings, low risk, bilateral: Secondary | ICD-10-CM | POA: Diagnosis not present

## 2018-01-15 DIAGNOSIS — H2513 Age-related nuclear cataract, bilateral: Secondary | ICD-10-CM | POA: Diagnosis not present

## 2018-01-15 DIAGNOSIS — H25013 Cortical age-related cataract, bilateral: Secondary | ICD-10-CM | POA: Diagnosis not present

## 2018-01-15 DIAGNOSIS — H353131 Nonexudative age-related macular degeneration, bilateral, early dry stage: Secondary | ICD-10-CM | POA: Diagnosis not present

## 2018-01-29 ENCOUNTER — Other Ambulatory Visit: Payer: Self-pay | Admitting: Obstetrics and Gynecology

## 2018-01-29 DIAGNOSIS — Z1231 Encounter for screening mammogram for malignant neoplasm of breast: Secondary | ICD-10-CM

## 2018-02-22 ENCOUNTER — Ambulatory Visit
Admission: RE | Admit: 2018-02-22 | Discharge: 2018-02-22 | Disposition: A | Discharge status: Home / Self Care | Payer: Medicare HMO | Source: Ambulatory Visit | Attending: Obstetrics and Gynecology | Admitting: Obstetrics and Gynecology

## 2018-02-22 DIAGNOSIS — Z1231 Encounter for screening mammogram for malignant neoplasm of breast: Secondary | ICD-10-CM

## 2018-03-18 DIAGNOSIS — G4733 Obstructive sleep apnea (adult) (pediatric): Secondary | ICD-10-CM | POA: Diagnosis not present

## 2018-04-05 DIAGNOSIS — I2510 Atherosclerotic heart disease of native coronary artery without angina pectoris: Secondary | ICD-10-CM | POA: Diagnosis not present

## 2018-04-05 DIAGNOSIS — E785 Hyperlipidemia, unspecified: Secondary | ICD-10-CM | POA: Diagnosis not present

## 2018-04-05 DIAGNOSIS — I10 Essential (primary) hypertension: Secondary | ICD-10-CM | POA: Diagnosis not present

## 2018-04-05 DIAGNOSIS — Z6841 Body Mass Index (BMI) 40.0 and over, adult: Secondary | ICD-10-CM | POA: Diagnosis not present

## 2018-04-05 DIAGNOSIS — F5101 Primary insomnia: Secondary | ICD-10-CM | POA: Diagnosis not present

## 2018-06-27 NOTE — Progress Notes (Signed)
Cardiology Office Note:    Date:  06/28/2018   ID:  Felicia Petersen, DOB [birth year], MRN [id number]  PCP:  Laurann Montana, MD  Cardiologist:  No primary care provider on file.   Referring MD: Laurann Montana, MD   Chief Complaint  Patient presents with  . Coronary Artery Disease    History of Present Illness:    Felicia Petersen is a 67 y.o. female with a hx of chest pain and dyspnea,  prior bypass grafting in 2000 with LIMA to LAD, SVG to ramus, and SVG to OM, hyperlipidemia, hypertension, and obstructive sleep apnea.   She is doing well.  She is exercising.  She has lost weight.  She inquires about being taken off of Plavix.  She has not had lower extremity swelling, orthopnea, PND, or palpitations.   Past Medical History:  Diagnosis Date  . Hyperlipidemia   . Hypertension     Past Surgical History:  Procedure Laterality Date  . BREAST EXCISIONAL BIOPSY Right   . BYPASS GRAFT     triple bypass  . REPLACEMENT TOTAL KNEE Left     Current Medications: Current Meds  Medication Sig  . amLODipine (NORVASC) 10 MG tablet Take 10 mg by mouth daily.  Marland Kitchen aspirin 81 MG tablet Take 81 mg by mouth daily.  Marland Kitchen atenolol (TENORMIN) 50 MG tablet Take 50 mg by mouth daily.  . clopidogrel (PLAVIX) 75 MG tablet Take 75 mg by mouth daily with breakfast.  . furosemide (LASIX) 40 MG tablet Take 40 mg by mouth.  . Multiple Vitamin (MULTIVITAMIN) tablet Take 1 tablet by mouth daily.  Marland Kitchen olmesartan (BENICAR) 40 MG tablet Take 40 mg by mouth daily.  . potassium chloride (K-DUR) 10 MEQ tablet Take 10 mEq by mouth daily.     Allergies:   Fish allergy and Penicillins   Social History   Socioeconomic History  . Marital status: Single    Spouse name: Not on file  . Number of children: Not on file  . Years of education: Not on file  . Highest education level: Not on file  Occupational History  . Not on file  Social Needs  . Financial resource strain: Not on file  . Food insecurity:    Worry:  Not on file    Inability: Not on file  . Transportation needs:    Medical: Not on file    Non-medical: Not on file  Tobacco Use  . Smoking status: Former Games developer  . Smokeless tobacco: Never Used  Substance and Sexual Activity  . Alcohol use: No  . Drug use: No  . Sexual activity: Never  Lifestyle  . Physical activity:    Days per week: Not on file    Minutes per session: Not on file  . Stress: Not on file  Relationships  . Social connections:    Talks on phone: Not on file    Gets together: Not on file    Attends religious service: Not on file    Active member of club or organization: Not on file    Attends meetings of clubs or organizations: Not on file    Relationship status: Not on file  Other Topics Concern  . Not on file  Social History Narrative  . Not on file     Family History: The patient's family history includes Breast cancer in her maternal aunt; Cancer in her mother and sister; Diabetes in her brother; Heart disease in her father and sister.  ROS:  Please see the history of present illness.    Lost weight.  [facility]  [facility] other systems reviewed and are negative.  EKGs/Labs/Other Studies Reviewed:    The following studies were reviewed today: Old records were reviewed  EKG:  EKG is  ordered today.  The ekg ordered today demonstrates normal sinus rhythm with first-degree AV block and prominent LVH/voltage.  Recent Labs: 07/20/2017: ALT 17  Recent Lipid Panel    Component Value Date/Time   CHOL 133 07/20/2017 0917   TRIG 79 07/20/2017 0917   HDL 42 07/20/2017 0917   CHOLHDL 3.2 07/20/2017 0917   LDLCALC 75 07/20/2017 0917    Physical Exam:    VS:  BP 140/80   Pulse (!) 56   Ht 5\' 4"  (1.626 m)   Wt 238 lb (108 kg)   BMI 40.85 kg/m     Wt Readings from Last 3 Encounters:  06/28/18 238 lb (108 kg)  06/11/17 248 lb (112.5 kg)  06/05/17 248 lb 12.8 oz (112.9 kg)     GEN: Mildly obese. Well developed in no acute  distress HEENT: Normal NECK: No JVD. LYMPHATICS: No lymphadenopathy CARDIAC: RRR, no murmur, no gallop, no  edema. VASCULAR: 2+ bilateral radial and carotid pulses.  No bruits. RESPIRATORY:  Clear to auscultation without rales, wheezing or rhonchi  ABDOMEN: Soft, non-tender, non-distended, No pulsatile mass, MUSCULOSKELETAL: No deformity  SKIN: Warm and dry NEUROLOGIC:  Alert and oriented x 3 PSYCHIATRIC:  Normal affect   ASSESSMENT:    1. Coronary artery disease involving coronary bypass graft of native heart with angina pectoris (HCC)   2. Essential hypertension   3. Hyperlipidemia LDL goal <70   4. Obstructive sleep apnea    PLAN:    In order of problems listed above:  1. Clinically stable.  No angina.  Exercising regularly.  Has lost 10 pounds.  Encouraged aerobic activity, LDL less than 70, blood pressure 130/80 mmHg, greater than 150 minutes of aerobic activity per week. 2. Has not yet had diuretic.  Blood pressure is adequate.  Low-salt diet. 3. Lipid panel will be obtained today. 4. Continue compliance with CPAP.  Continue exercise and attempts to lose weight.  Medical follow-up in 1 year.   Medication Adjustments/Labs and Tests Ordered: Current medicines are reviewed at length with the patient today.  Concerns regarding medicines are outlined above.  No orders of the defined types were placed in this encounter.  No orders of the defined types were placed in this encounter.   There are no Patient Instructions on file for this visit.   Signed, Lesleigh Noe, MD  06/28/2018 8:18 AM    [facility]

## 2018-06-28 ENCOUNTER — Ambulatory Visit: Payer: Medicare HMO | Admitting: Interventional Cardiology

## 2018-06-28 ENCOUNTER — Encounter: Payer: Self-pay | Admitting: Interventional Cardiology

## 2018-06-28 VITALS — BP 140/80 | HR 56 | Ht 64.0 in | Wt 238.0 lb

## 2018-06-28 DIAGNOSIS — I25709 Atherosclerosis of coronary artery bypass graft(s), unspecified, with unspecified angina pectoris: Secondary | ICD-10-CM

## 2018-06-28 DIAGNOSIS — G4733 Obstructive sleep apnea (adult) (pediatric): Secondary | ICD-10-CM

## 2018-06-28 DIAGNOSIS — E785 Hyperlipidemia, unspecified: Secondary | ICD-10-CM

## 2018-06-28 DIAGNOSIS — I10 Essential (primary) hypertension: Secondary | ICD-10-CM

## 2018-06-28 LAB — COMPREHENSIVE METABOLIC PANEL
A/G RATIO: 1.6 (ref 1.2–2.2)
ALBUMIN: 3.9 g/dL (ref 3.6–4.8)
ALT: 11 IU/L (ref 0–32)
AST: 14 IU/L (ref 0–40)
Alkaline Phosphatase: 126 IU/L — ABNORMAL HIGH (ref 39–117)
BILIRUBIN TOTAL: 0.4 mg/dL (ref 0.0–1.2)
BUN / CREAT RATIO: 14 (ref 12–28)
BUN: 10 mg/dL (ref 8–27)
CHLORIDE: 105 mmol/L (ref 96–106)
CO2: 23 mmol/L (ref 20–29)
Calcium: 9.2 mg/dL (ref 8.7–10.3)
Creatinine, Ser: 0.71 mg/dL (ref 0.57–1.00)
GFR, EST AFRICAN AMERICAN: 102 mL/min/{1.73_m2} (ref >59)
GFR, EST NON AFRICAN AMERICAN: 88 mL/min/{1.73_m2} (ref >59)
GLOBULIN, TOTAL: 2.5 g/dL (ref 1.5–4.5)
Glucose: 108 mg/dL — ABNORMAL HIGH (ref 65–99)
Potassium: 4.2 mmol/L (ref 3.5–5.2)
SODIUM: 143 mmol/L (ref 134–144)
TOTAL PROTEIN: 6.4 g/dL (ref 6.0–8.5)

## 2018-06-28 LAB — LIPID PANEL
CHOL/HDL RATIO: 2.9 ratio (ref 0.0–4.4)
Cholesterol, Total: 129 mg/dL (ref 100–199)
HDL: 45 mg/dL (ref >39)
LDL Calculated: 72 mg/dL (ref 0–99)
Triglycerides: 62 mg/dL (ref 0–149)
VLDL Cholesterol Cal: 12 mg/dL (ref 5–40)

## 2018-06-28 NOTE — Patient Instructions (Signed)
Medication Instructions:  1) STOP PLAVIX If you need a refill on your cardiac medications before your next appointment, please call your pharmacy.   Lab work: TODAY: CMET, lipid panel If you have labs (blood work) drawn today and your tests are completely normal, you will receive your results only by: Marland Kitchen MyChart Message (if you have MyChart) OR . A paper copy in the mail If you have any lab test that is abnormal or we need to change your treatment, we will call you to review the results.  Testing/Procedures: None  Follow-Up: At [facility], you and your health needs are our priority.  As part of our continuing mission to provide you with exceptional heart care, we have created designated Provider Care Teams.  These Care Teams include your primary Cardiologist (physician) and Advanced Practice Providers (APPs -  Physician Assistants and Nurse Practitioners) who all work together to provide you with the care you need, when you need it. You will need a follow up appointment in 12 months.  Please call our office 2 months in advance to schedule this appointment.  You may see Dr. Katrinka Blazing or one of the following Advanced Practice Providers on your designated Care Team:   Norma Fredrickson, NP Nada Boozer, NP . Georgie Chard, NP

## 2018-06-30 ENCOUNTER — Telehealth: Payer: Self-pay | Admitting: *Deleted

## 2018-06-30 NOTE — Telephone Encounter (Signed)
-----   Message from Lyn Records, MD sent at 06/30/2018 12:23 PM EDT ----- Let the patient know lipids are at target, kidney function is normal, potassium is normal.  Laboratory data is very reassuring.  No changes needed. A copy will be sent to Laurann Montana, MD

## 2018-06-30 NOTE — Telephone Encounter (Signed)
Pt has been notified of lab results by phone with verbal understanding. Pt thanked me for the call. I will forward a copy of results to PCP Dr. Laurann Montana.

## 2018-07-13 DIAGNOSIS — Z23 Encounter for immunization: Secondary | ICD-10-CM | POA: Diagnosis not present

## 2018-07-23 ENCOUNTER — Other Ambulatory Visit: Payer: Self-pay | Admitting: Interventional Cardiology

## 2018-08-31 DIAGNOSIS — G4733 Obstructive sleep apnea (adult) (pediatric): Secondary | ICD-10-CM | POA: Diagnosis not present

## 2018-10-04 DIAGNOSIS — G4733 Obstructive sleep apnea (adult) (pediatric): Secondary | ICD-10-CM | POA: Diagnosis not present

## 2018-10-18 DIAGNOSIS — G4733 Obstructive sleep apnea (adult) (pediatric): Secondary | ICD-10-CM | POA: Diagnosis not present

## 2018-11-08 DIAGNOSIS — Z0000 Encounter for general adult medical examination without abnormal findings: Secondary | ICD-10-CM | POA: Diagnosis not present

## 2018-11-08 DIAGNOSIS — I10 Essential (primary) hypertension: Secondary | ICD-10-CM | POA: Diagnosis not present

## 2018-11-08 DIAGNOSIS — Z23 Encounter for immunization: Secondary | ICD-10-CM | POA: Diagnosis not present

## 2018-11-08 DIAGNOSIS — H35033 Hypertensive retinopathy, bilateral: Secondary | ICD-10-CM | POA: Diagnosis not present

## 2018-11-08 DIAGNOSIS — H353131 Nonexudative age-related macular degeneration, bilateral, early dry stage: Secondary | ICD-10-CM | POA: Diagnosis not present

## 2018-11-08 DIAGNOSIS — I2510 Atherosclerotic heart disease of native coronary artery without angina pectoris: Secondary | ICD-10-CM | POA: Diagnosis not present

## 2018-11-08 DIAGNOSIS — G4733 Obstructive sleep apnea (adult) (pediatric): Secondary | ICD-10-CM | POA: Diagnosis not present

## 2018-11-08 DIAGNOSIS — E785 Hyperlipidemia, unspecified: Secondary | ICD-10-CM | POA: Diagnosis not present

## 2018-11-12 ENCOUNTER — Other Ambulatory Visit: Payer: Self-pay | Admitting: Family Medicine

## 2018-11-12 DIAGNOSIS — E2839 Other primary ovarian failure: Secondary | ICD-10-CM

## 2018-11-18 DIAGNOSIS — G4733 Obstructive sleep apnea (adult) (pediatric): Secondary | ICD-10-CM | POA: Diagnosis not present

## 2018-12-13 ENCOUNTER — Ambulatory Visit: Payer: Medicare HMO | Admitting: Obstetrics and Gynecology

## 2018-12-17 DIAGNOSIS — G4733 Obstructive sleep apnea (adult) (pediatric): Secondary | ICD-10-CM | POA: Diagnosis not present

## 2018-12-22 DIAGNOSIS — G4733 Obstructive sleep apnea (adult) (pediatric): Secondary | ICD-10-CM | POA: Diagnosis not present

## 2018-12-24 ENCOUNTER — Other Ambulatory Visit: Payer: Medicare HMO

## 2019-01-17 DIAGNOSIS — G4733 Obstructive sleep apnea (adult) (pediatric): Secondary | ICD-10-CM | POA: Diagnosis not present

## 2019-01-24 ENCOUNTER — Ambulatory Visit: Payer: Medicare HMO | Admitting: Obstetrics and Gynecology

## 2019-02-08 ENCOUNTER — Other Ambulatory Visit: Payer: Self-pay | Admitting: Obstetrics and Gynecology

## 2019-02-08 DIAGNOSIS — Z1231 Encounter for screening mammogram for malignant neoplasm of breast: Secondary | ICD-10-CM

## 2019-02-09 DIAGNOSIS — G4733 Obstructive sleep apnea (adult) (pediatric): Secondary | ICD-10-CM | POA: Diagnosis not present

## 2019-02-11 ENCOUNTER — Other Ambulatory Visit: Payer: Medicare HMO

## 2019-02-16 DIAGNOSIS — G4733 Obstructive sleep apnea (adult) (pediatric): Secondary | ICD-10-CM | POA: Diagnosis not present

## 2019-03-17 DIAGNOSIS — I2510 Atherosclerotic heart disease of native coronary artery without angina pectoris: Secondary | ICD-10-CM | POA: Diagnosis not present

## 2019-03-17 DIAGNOSIS — H35033 Hypertensive retinopathy, bilateral: Secondary | ICD-10-CM | POA: Diagnosis not present

## 2019-03-17 DIAGNOSIS — E785 Hyperlipidemia, unspecified: Secondary | ICD-10-CM | POA: Diagnosis not present

## 2019-03-17 DIAGNOSIS — M170 Bilateral primary osteoarthritis of knee: Secondary | ICD-10-CM | POA: Diagnosis not present

## 2019-03-17 DIAGNOSIS — I10 Essential (primary) hypertension: Secondary | ICD-10-CM | POA: Diagnosis not present

## 2019-03-19 DIAGNOSIS — G4733 Obstructive sleep apnea (adult) (pediatric): Secondary | ICD-10-CM | POA: Diagnosis not present

## 2019-03-30 ENCOUNTER — Ambulatory Visit
Admission: RE | Admit: 2019-03-30 | Discharge: 2019-03-30 | Disposition: A | Discharge status: Home / Self Care | Payer: Medicare HMO | Source: Ambulatory Visit | Attending: Obstetrics and Gynecology | Admitting: Obstetrics and Gynecology

## 2019-03-30 ENCOUNTER — Other Ambulatory Visit: Payer: Self-pay

## 2019-03-30 DIAGNOSIS — Z1231 Encounter for screening mammogram for malignant neoplasm of breast: Secondary | ICD-10-CM | POA: Diagnosis not present

## 2019-04-01 ENCOUNTER — Ambulatory Visit
Admission: RE | Admit: 2019-04-01 | Discharge: 2019-04-01 | Disposition: A | Discharge status: Home / Self Care | Payer: Medicare HMO | Source: Ambulatory Visit | Attending: Family Medicine | Admitting: Family Medicine

## 2019-04-01 ENCOUNTER — Other Ambulatory Visit: Payer: Self-pay

## 2019-04-01 DIAGNOSIS — Z13820 Encounter for screening for osteoporosis: Secondary | ICD-10-CM | POA: Diagnosis not present

## 2019-04-01 DIAGNOSIS — E2839 Other primary ovarian failure: Secondary | ICD-10-CM

## 2019-04-01 DIAGNOSIS — Z780 Asymptomatic menopausal state: Secondary | ICD-10-CM | POA: Diagnosis not present

## 2019-04-08 DIAGNOSIS — M170 Bilateral primary osteoarthritis of knee: Secondary | ICD-10-CM | POA: Diagnosis not present

## 2019-04-08 DIAGNOSIS — H35033 Hypertensive retinopathy, bilateral: Secondary | ICD-10-CM | POA: Diagnosis not present

## 2019-04-08 DIAGNOSIS — I10 Essential (primary) hypertension: Secondary | ICD-10-CM | POA: Diagnosis not present

## 2019-04-08 DIAGNOSIS — E785 Hyperlipidemia, unspecified: Secondary | ICD-10-CM | POA: Diagnosis not present

## 2019-04-08 DIAGNOSIS — I2510 Atherosclerotic heart disease of native coronary artery without angina pectoris: Secondary | ICD-10-CM | POA: Diagnosis not present

## 2019-04-13 ENCOUNTER — Other Ambulatory Visit: Payer: Self-pay

## 2019-04-13 ENCOUNTER — Ambulatory Visit (INDEPENDENT_AMBULATORY_CARE_PROVIDER_SITE_OTHER): Payer: Medicare HMO | Admitting: Obstetrics and Gynecology

## 2019-04-13 ENCOUNTER — Encounter: Payer: Self-pay | Admitting: Obstetrics and Gynecology

## 2019-04-13 VITALS — BP 151/81 | HR 59 | Ht 63.0 in | Wt 239.0 lb

## 2019-04-13 DIAGNOSIS — Z01419 Encounter for gynecological examination (general) (routine) without abnormal findings: Secondary | ICD-10-CM

## 2019-04-13 NOTE — Progress Notes (Signed)
Subjective:     Felicia Petersen is a 68 y.o. female postmenopausal with BMI 42 who is here for a comprehensive physical exam. The patient reports no problems. Patient is not sexually active. She denies any urinary incontinence, vaginal bleeding, pelvic pain or abnormal discharge  Past Medical History:  Diagnosis Date  . Hyperlipidemia   . Hypertension    Past Surgical History:  Procedure Laterality Date  . BREAST EXCISIONAL BIOPSY Right   . BYPASS GRAFT     triple bypass  . REPLACEMENT TOTAL KNEE Left    Family History  Problem Relation Age of Onset  . Cancer Mother   . Heart disease Father   . Cancer Sister   . Heart disease Sister   . Diabetes Brother   . Breast cancer Maternal Aunt     Social History   Socioeconomic History  . Marital status: Single    Spouse name: Not on file  . Number of children: Not on file  . Years of education: Not on file  . Highest education level: Not on file  Occupational History  . Not on file  Social Needs  . Financial resource strain: Not on file  . Food insecurity    Worry: Not on file    Inability: Not on file  . Transportation needs    Medical: Not on file    Non-medical: Not on file  Tobacco Use  . Smoking status: Former Research scientist (life sciences)  . Smokeless tobacco: Never Used  Substance and Sexual Activity  . Alcohol use: No  . Drug use: No  . Sexual activity: Never  Lifestyle  . Physical activity    Days per week: Not on file    Minutes per session: Not on file  . Stress: Not on file  Relationships  . Social Herbalist on phone: Not on file    Gets together: Not on file    Attends religious service: Not on file    Active member of club or organization: Not on file    Attends meetings of clubs or organizations: Not on file    Relationship status: Not on file  . Intimate partner violence    Fear of current or ex partner: Not on file    Emotionally abused: Not on file    Physically abused: Not on file    Forced sexual  activity: Not on file  Other Topics Concern  . Not on file  Social History Narrative  . Not on file   Health Maintenance  Topic Date Due  . Hepatitis C Screening  [birth year]  . TETANUS/TDAP  11/02/1969  . COLONOSCOPY  11/02/2000  . PNA vac Low Risk Adult (1 of 2 - PCV13) 11/03/2015  . INFLUENZA VACCINE  04/23/2019  . MAMMOGRAM  03/29/2021  . DEXA SCAN  Completed    Review of Systems Pertinent items are noted in HPI.   Objective:  Blood pressure (!) 151/81, pulse (!) 59, height 5\' 3"  (1.6 m), weight 239 lb (108.4 kg).     GENERAL: Well-developed, well-nourished female in no acute distress.  HEENT: Normocephalic, atraumatic. Sclerae anicteric.  NECK: Supple. Normal thyroid.  LUNGS: Clear to auscultation bilaterally.  HEART: Regular rate and rhythm. BREASTS: Symmetric in size. No palpable masses or lymphadenopathy, skin changes, or nipple drainage. ABDOMEN: Soft, nontender, nondistended. No organomegaly. PELVIC: Normal external female genitalia. Vagina is pale pink and atrophic.  Normal discharge. Normal appearing cervix. Uterus is normal in size. No adnexal mass or tenderness.  EXTREMITIES: No cyanosis, clubbing, or edema, 2+ distal pulses.    Assessment:    Healthy female exam.      Plan:    Normal mammogram in July Patient with recent bone density scan - follow up with PCP and continue calicum and vitamin d supplements Patient scheduled to see PCP next month RTC prn See After Visit Summary for Counseling Recommendations

## 2019-04-18 DIAGNOSIS — G4733 Obstructive sleep apnea (adult) (pediatric): Secondary | ICD-10-CM | POA: Diagnosis not present

## 2019-04-27 ENCOUNTER — Other Ambulatory Visit: Payer: Self-pay

## 2019-04-27 DIAGNOSIS — Z20822 Contact with and (suspected) exposure to covid-19: Secondary | ICD-10-CM

## 2019-04-28 LAB — NOVEL CORONAVIRUS, NAA: SARS-CoV-2, NAA: NOT DETECTED

## 2019-05-16 DIAGNOSIS — Z1159 Encounter for screening for other viral diseases: Secondary | ICD-10-CM | POA: Diagnosis not present

## 2019-05-16 DIAGNOSIS — F5101 Primary insomnia: Secondary | ICD-10-CM | POA: Diagnosis not present

## 2019-05-16 DIAGNOSIS — I2510 Atherosclerotic heart disease of native coronary artery without angina pectoris: Secondary | ICD-10-CM | POA: Diagnosis not present

## 2019-05-16 DIAGNOSIS — I10 Essential (primary) hypertension: Secondary | ICD-10-CM | POA: Diagnosis not present

## 2019-05-16 DIAGNOSIS — E785 Hyperlipidemia, unspecified: Secondary | ICD-10-CM | POA: Diagnosis not present

## 2019-05-16 DIAGNOSIS — R202 Paresthesia of skin: Secondary | ICD-10-CM | POA: Diagnosis not present

## 2019-05-19 DIAGNOSIS — G4733 Obstructive sleep apnea (adult) (pediatric): Secondary | ICD-10-CM | POA: Diagnosis not present

## 2019-05-23 DIAGNOSIS — M170 Bilateral primary osteoarthritis of knee: Secondary | ICD-10-CM | POA: Diagnosis not present

## 2019-05-23 DIAGNOSIS — I2510 Atherosclerotic heart disease of native coronary artery without angina pectoris: Secondary | ICD-10-CM | POA: Diagnosis not present

## 2019-05-23 DIAGNOSIS — H35033 Hypertensive retinopathy, bilateral: Secondary | ICD-10-CM | POA: Diagnosis not present

## 2019-05-23 DIAGNOSIS — E785 Hyperlipidemia, unspecified: Secondary | ICD-10-CM | POA: Diagnosis not present

## 2019-05-23 DIAGNOSIS — I10 Essential (primary) hypertension: Secondary | ICD-10-CM | POA: Diagnosis not present

## 2019-05-27 DIAGNOSIS — Z23 Encounter for immunization: Secondary | ICD-10-CM | POA: Diagnosis not present

## 2019-05-27 DIAGNOSIS — H25013 Cortical age-related cataract, bilateral: Secondary | ICD-10-CM | POA: Diagnosis not present

## 2019-05-27 DIAGNOSIS — H2513 Age-related nuclear cataract, bilateral: Secondary | ICD-10-CM | POA: Diagnosis not present

## 2019-05-27 DIAGNOSIS — Z1159 Encounter for screening for other viral diseases: Secondary | ICD-10-CM | POA: Diagnosis not present

## 2019-05-27 DIAGNOSIS — R202 Paresthesia of skin: Secondary | ICD-10-CM | POA: Diagnosis not present

## 2019-05-27 DIAGNOSIS — E785 Hyperlipidemia, unspecified: Secondary | ICD-10-CM | POA: Diagnosis not present

## 2019-05-27 DIAGNOSIS — H40013 Open angle with borderline findings, low risk, bilateral: Secondary | ICD-10-CM | POA: Diagnosis not present

## 2019-05-27 DIAGNOSIS — H353131 Nonexudative age-related macular degeneration, bilateral, early dry stage: Secondary | ICD-10-CM | POA: Diagnosis not present

## 2019-05-27 DIAGNOSIS — I10 Essential (primary) hypertension: Secondary | ICD-10-CM | POA: Diagnosis not present

## 2019-06-16 DIAGNOSIS — E785 Hyperlipidemia, unspecified: Secondary | ICD-10-CM | POA: Diagnosis not present

## 2019-06-16 DIAGNOSIS — I2510 Atherosclerotic heart disease of native coronary artery without angina pectoris: Secondary | ICD-10-CM | POA: Diagnosis not present

## 2019-06-16 DIAGNOSIS — I10 Essential (primary) hypertension: Secondary | ICD-10-CM | POA: Diagnosis not present

## 2019-06-16 DIAGNOSIS — M170 Bilateral primary osteoarthritis of knee: Secondary | ICD-10-CM | POA: Diagnosis not present

## 2019-06-16 DIAGNOSIS — H35033 Hypertensive retinopathy, bilateral: Secondary | ICD-10-CM | POA: Diagnosis not present

## 2019-06-17 IMAGING — NM NM MISC PROCEDURE
3 series · 18 of 18 positions shown · non-contrast
Comparison: none

[Series 1: wbr_s-proj_st stress_(id)_sa · 6.5mm · 6.51mm/px · 6 of 512 frames shown (1 of 2)]
[frame 43/512]
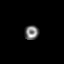
[frame 128/512]
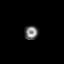
[frame 214/512]
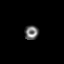
[frame 299/512]
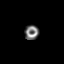
[frame 384/512]
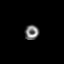
[frame 470/512]
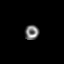

[Series 1: wbr_s-proj_st stress_(id)_sa · 6.5mm · 6.51mm/px · 6 of 64 frames shown (2 of 2)]
[frame 6/64]
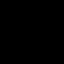
[frame 16/64]
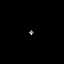
[frame 27/64]
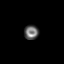
[frame 38/64]
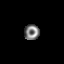
[frame 48/64]
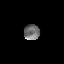
[frame 59/64]
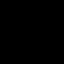

[Series 1: wbr_r-proj_st rest_(id)_sa · 6.5mm · 6.51mm/px · 6 of 64 frames shown]
[frame 6/64]
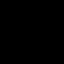
[frame 16/64]
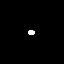
[frame 27/64]
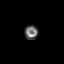
[frame 38/64]
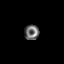
[frame 48/64]
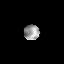
[frame 59/64]
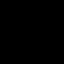

[18 of 18 positions shown; findings below may reference images not displayed]

Canned report from images found in remote index.

Refer to host system for actual result text.

## 2019-06-19 DIAGNOSIS — G4733 Obstructive sleep apnea (adult) (pediatric): Secondary | ICD-10-CM | POA: Diagnosis not present

## 2019-06-27 ENCOUNTER — Other Ambulatory Visit: Payer: Self-pay | Admitting: Interventional Cardiology

## 2019-06-27 MED ORDER — ROSUVASTATIN CALCIUM 20 MG PO TABS: 20.0000 mg | ORAL_TABLET | Freq: Every day | ORAL | 0 refills | Status: DC

## 2019-06-27 NOTE — Telephone Encounter (Signed)
Pt's medication was sent to pt's pharmacy as requested. Confirmation received.  °

## 2019-07-11 DIAGNOSIS — G4733 Obstructive sleep apnea (adult) (pediatric): Secondary | ICD-10-CM | POA: Diagnosis not present

## 2019-07-14 DIAGNOSIS — M170 Bilateral primary osteoarthritis of knee: Secondary | ICD-10-CM | POA: Diagnosis not present

## 2019-07-14 DIAGNOSIS — H35033 Hypertensive retinopathy, bilateral: Secondary | ICD-10-CM | POA: Diagnosis not present

## 2019-07-14 DIAGNOSIS — I10 Essential (primary) hypertension: Secondary | ICD-10-CM | POA: Diagnosis not present

## 2019-07-14 DIAGNOSIS — E785 Hyperlipidemia, unspecified: Secondary | ICD-10-CM | POA: Diagnosis not present

## 2019-07-14 DIAGNOSIS — I2510 Atherosclerotic heart disease of native coronary artery without angina pectoris: Secondary | ICD-10-CM | POA: Diagnosis not present

## 2019-07-19 DIAGNOSIS — G4733 Obstructive sleep apnea (adult) (pediatric): Secondary | ICD-10-CM | POA: Diagnosis not present

## 2019-08-19 DIAGNOSIS — G4733 Obstructive sleep apnea (adult) (pediatric): Secondary | ICD-10-CM | POA: Diagnosis not present

## 2019-08-21 NOTE — Progress Notes (Signed)
Cardiology Office Note:    Date:  08/24/2019   ID:  Felicia SageBrenda D Fyfe, DOB 13-Jan-1951, MRN [id number]  PCP:  Laurann MontanaWhite, Cynthia, MD  Cardiologist:  Lesleigh NoeHenry W Smith III, MD   Referring MD: Laurann MontanaWhite, Cynthia, MD   Chief Complaint  Patient presents with  . Coronary Artery Disease  . Hypertension    History of Present Illness:    Felicia Petersen is a 68 y.o. female with a hx of chest pain and dyspnea,  prior bypass grafting in 2000 with LIMA to LAD, SVG to ramus, and SVG to OM, hyperlipidemia, hypertension, and obstructive sleep apnea.   She feels well.  She has no cardiac complaints.  She denies chest pain.  There is no orthopnea, PND, lower extremity swelling.  She has not had palpitations or syncope.  She no longer smokes cigarettes.  Past Medical History:  Diagnosis Date  . Hyperlipidemia   . Hypertension     Past Surgical History:  Procedure Laterality Date  . BREAST EXCISIONAL BIOPSY Right   . BYPASS GRAFT     triple bypass  . REPLACEMENT TOTAL KNEE Left     Current Medications: Current Meds  Medication Sig  . amLODipine (NORVASC) 10 MG tablet Take 10 mg by mouth daily.  Marland Kitchen. aspirin 81 MG tablet Take 81 mg by mouth daily.  Marland Kitchen. atenolol (TENORMIN) 50 MG tablet Take 50 mg by mouth daily.  . Cholecalciferol (VITAMIN D-3) 1000 UNITS CAPS Take by mouth.  . furosemide (LASIX) 40 MG tablet Take 40 mg by mouth.  . Multiple Vitamin (MULTIVITAMIN) tablet Take 1 tablet by mouth daily.  Marland Kitchen. olmesartan (BENICAR) 40 MG tablet Take 40 mg by mouth daily.  . potassium chloride SA (KLOR-CON) 20 MEQ tablet Take 20 mEq by mouth daily.  . rosuvastatin (CRESTOR) 20 MG tablet Take 1 tablet (20 mg total) by mouth daily. Please keep upcoming appt in December with Dr. Katrinka BlazingSmith before anymore refills. Thank you  . Suvorexant (BELSOMRA) 20 MG TABS Take 20 mg by mouth daily.   . Turmeric (QC TUMERIC COMPLEX PO) Take by mouth.     Allergies:   Fish allergy and Penicillins   Social History   Socioeconomic  History  . Marital status: Single    Spouse name: Not on file  . Number of children: Not on file  . Years of education: Not on file  . Highest education level: Not on file  Occupational History  . Not on file  Social Needs  . Financial resource strain: Not on file  . Food insecurity    Worry: Not on file    Inability: Not on file  . Transportation needs    Medical: Not on file    Non-medical: Not on file  Tobacco Use  . Smoking status: Former Games developermoker  . Smokeless tobacco: Never Used  Substance and Sexual Activity  . Alcohol use: No  . Drug use: No  . Sexual activity: Never  Lifestyle  . Physical activity    Days per week: Not on file    Minutes per session: Not on file  . Stress: Not on file  Relationships  . Social Musicianconnections    Talks on phone: Not on file    Gets together: Not on file    Attends religious service: Not on file    Active member of club or organization: Not on file    Attends meetings of clubs or organizations: Not on file    Relationship status: Not on file  Other Topics Concern  . Not on file  Social History Narrative  . Not on file     Family History: The patient's family history includes Breast cancer in her maternal aunt; Cancer in her mother and sister; Diabetes in her brother; Heart disease in her father and sister.  ROS:   Please see the history of present illness.    She has had some difficulty with itching.  She mentioned this to Dr. Dema Severin.  She only gets about 4 hours of sleep per night related to sleep apnea and insomnia.  She does admit to compliance with CPAP.  All other systems reviewed and are negative.  EKGs/Labs/Other Studies Reviewed:    The following studies were reviewed today: No recent cardiac imaging since last office visit. Nuclear testing in 2018: Study Highlights    Nuclear stress EF: 53%.  There was no ST segment deviation noted during stress.  This is a low risk study.  The left ventricular ejection fraction  is mildly decreased (45-54%).   Normal perfusion no ischemia or infarct Surface images with apical hypokinesis EF 53%     EKG:  EKG normal sinus rhythm/sinus bradycardia with prominent voltage.  Nonspecific T wave flattening.  When compared to the prior tracing from October 2019, voltage for left ventricular hypertrophy is less apparent.  Recent Labs: No results found for requested labs within last 8760 hours.  Recent Lipid Panel    Component Value Date/Time   CHOL 129 06/28/2018 0828   TRIG 62 06/28/2018 0828   HDL 45 06/28/2018 0828   CHOLHDL 2.9 06/28/2018 0828   LDLCALC 72 06/28/2018 0828    Physical Exam:    VS:  BP (!) 146/88   Pulse (!) 56   Ht 5\' 3"  (1.6 m)   Wt 241 lb 12.8 oz (109.7 kg)   SpO2 97%   BMI 42.83 kg/m     Wt Readings from Last 3 Encounters:  08/24/19 241 lb 12.8 oz (109.7 kg)  04/13/19 239 lb (108.4 kg)  06/28/18 238 lb (108 kg)     GEN: Obesity. No acute distress HEENT: Normal NECK: No JVD. LYMPHATICS: No lymphadenopathy CARDIAC: 2/6 right upper sternal border systolic crescendo decrescendo murmur.  RRR with no diastolic murmur, gallop, or edema. VASCULAR:  Normal Pulses. No bruits. RESPIRATORY:  Clear to auscultation without rales, wheezing or rhonchi  ABDOMEN: Soft, non-tender, non-distended, No pulsatile mass, MUSCULOSKELETAL: No deformity  SKIN: Warm and dry NEUROLOGIC:  Alert and oriented x 3 PSYCHIATRIC:  Normal affect   ASSESSMENT:    1. Coronary artery disease involving coronary bypass graft of native heart with angina pectoris ([place])   2. Essential hypertension   3. Hyperlipidemia LDL goal <70   4. Systolic murmur   5. Obstructive sleep apnea   6. Educated about COVID-19 virus infection    PLAN:    In order of problems listed above:  1. Overall doing well.  Secondary prevention discussed.  I did encourage weight loss.  I encouraged increasing sleep. 2. She has not had amlodipine yet today.  I presume this is why blood  pressure is mildly elevated.  She did not give a reason for not having taken the medication yet today.  Says she just did not have time before having to come here.  Target blood pressure is explained to the patient and is 130/80 mmHg or less. 3. LDL target less than 70 and was 76 when checked earlier this September 4. The systolic murmur is consistent with  aortic stenosis.  2D Doppler echocardiogram will be done to confirm anatomy and determine if serial follow-up is needed. 5. Compliant with CPAP.  Weight loss is encouraged. 6. The 3W's is observed and being practiced to avoid COVID-19 infection  Overall education and awareness concerning primary/secondary risk prevention was discussed in detail: LDL less than 70, hemoglobin A1c less than 7, blood pressure target less than 130/80 mmHg, >150 minutes of moderate aerobic activity per week, avoidance of smoking, weight control (via diet and exercise), and continued surveillance/management of/for obstructive sleep apnea.    Medication Adjustments/Labs and Tests Ordered: Current medicines are reviewed at length with the patient today.  Concerns regarding medicines are outlined above.  Orders Placed This Encounter  Procedures  . EKG 12-Lead  . ECHOCARDIOGRAM COMPLETE   No orders of the defined types were placed in this encounter.   Patient Instructions  Medication Instructions:  Your physician recommends that you continue on your current medications as directed. Please refer to the Current Medication list given to you today.   *If you need a refill on your cardiac medications before your next appointment, please call your pharmacy*  Lab Work: None If you have labs (blood work) drawn today and your tests are completely normal, you will receive your results only by: Marland Kitchen MyChart Message (if you have MyChart) OR . A paper copy in the mail If you have any lab test that is abnormal or we need to change your treatment, we will call you to review the  results.  Testing/Procedures: Your physician has requested that you have an echocardiogram. Echocardiography is a painless test that uses sound waves to create images of your heart. It provides your doctor with information about the size and shape of your heart and how well your heart's chambers and valves are working. This procedure takes approximately one hour. There are no restrictions for this procedure.  Follow-Up: At [facility], you and your health needs are our priority.  As part of our continuing mission to provide you with exceptional heart care, we have created designated Provider Care Teams.  These Care Teams include your primary Cardiologist (physician) and Advanced Practice Providers (APPs -  Physician Assistants and Nurse Practitioners) who all work together to provide you with the care you need, when you need it.  Your next appointment:   12 month(s)  The format for your next appointment:   In Person  Provider:   You may see Lesleigh Noe, MD or one of the following Advanced Practice Providers on your designated Care Team:    Norma Fredrickson, NP  Nada Boozer, NP  Georgie Chard, NP   Other Instructions      Signed, Lesleigh Noe, MD  08/24/2019 8:28 AM    [facility]

## 2019-08-24 ENCOUNTER — Other Ambulatory Visit: Payer: Self-pay

## 2019-08-24 ENCOUNTER — Encounter: Payer: Self-pay | Admitting: Interventional Cardiology

## 2019-08-24 ENCOUNTER — Ambulatory Visit: Payer: Medicare HMO | Admitting: Interventional Cardiology

## 2019-08-24 VITALS — BP 146/88 | HR 56 | Ht 63.0 in | Wt 241.8 lb

## 2019-08-24 DIAGNOSIS — G4733 Obstructive sleep apnea (adult) (pediatric): Secondary | ICD-10-CM

## 2019-08-24 DIAGNOSIS — E785 Hyperlipidemia, unspecified: Secondary | ICD-10-CM | POA: Diagnosis not present

## 2019-08-24 DIAGNOSIS — I10 Essential (primary) hypertension: Secondary | ICD-10-CM | POA: Diagnosis not present

## 2019-08-24 DIAGNOSIS — Z7189 Other specified counseling: Secondary | ICD-10-CM

## 2019-08-24 DIAGNOSIS — R011 Cardiac murmur, unspecified: Secondary | ICD-10-CM | POA: Diagnosis not present

## 2019-08-24 DIAGNOSIS — I25709 Atherosclerosis of coronary artery bypass graft(s), unspecified, with unspecified angina pectoris: Secondary | ICD-10-CM | POA: Diagnosis not present

## 2019-08-24 NOTE — Patient Instructions (Signed)
Medication Instructions:  Your physician recommends that you continue on your current medications as directed. Please refer to the Current Medication list given to you today.  *If you need a refill on your cardiac medications before your next appointment, please call your pharmacy*  Lab Work: None If you have labs (blood work) drawn today and your tests are completely normal, you will receive your results only by: . MyChart Message (if you have MyChart) OR . A paper copy in the mail If you have any lab test that is abnormal or we need to change your treatment, we will call you to review the results.  Testing/Procedures: Your physician has requested that you have an echocardiogram. Echocardiography is a painless test that uses sound waves to create images of your heart. It provides your doctor with information about the size and shape of your heart and how well your heart's chambers and valves are working. This procedure takes approximately one hour. There are no restrictions for this procedure.   Follow-Up: At [facility], you and your health needs are our priority.  As part of our continuing mission to provide you with exceptional heart care, we have created designated Provider Care Teams.  These Care Teams include your primary Cardiologist (physician) and Advanced Practice Providers (APPs -  Physician Assistants and Nurse Practitioners) who all work together to provide you with the care you need, when you need it.  Your next appointment:   12 month(s)  The format for your next appointment:   In Person  Provider:   You may see Henry W Smith III, MD or one of the following Advanced Practice Providers on your designated Care Team:    Lori Gerhardt, NP  Laura Ingold, NP  Jill McDaniel, NP   Other Instructions   

## 2019-08-31 ENCOUNTER — Ambulatory Visit (HOSPITAL_COMMUNITY): Payer: Medicare HMO | Attending: Internal Medicine

## 2019-08-31 ENCOUNTER — Other Ambulatory Visit: Payer: Self-pay

## 2019-08-31 DIAGNOSIS — R011 Cardiac murmur, unspecified: Secondary | ICD-10-CM

## 2019-09-18 DIAGNOSIS — G4733 Obstructive sleep apnea (adult) (pediatric): Secondary | ICD-10-CM | POA: Diagnosis not present

## 2019-10-04 ENCOUNTER — Other Ambulatory Visit: Payer: Self-pay | Admitting: Interventional Cardiology

## 2019-10-19 DIAGNOSIS — G4733 Obstructive sleep apnea (adult) (pediatric): Secondary | ICD-10-CM | POA: Diagnosis not present

## 2019-11-09 ENCOUNTER — Ambulatory Visit: Payer: Medicare HMO | Admitting: Orthopaedic Surgery

## 2019-11-09 ENCOUNTER — Other Ambulatory Visit: Payer: Self-pay

## 2019-11-09 ENCOUNTER — Ambulatory Visit (INDEPENDENT_AMBULATORY_CARE_PROVIDER_SITE_OTHER): Payer: Medicare HMO

## 2019-11-09 ENCOUNTER — Encounter: Payer: Self-pay | Admitting: Orthopaedic Surgery

## 2019-11-09 VITALS — Ht 64.0 in | Wt 242.0 lb

## 2019-11-09 DIAGNOSIS — M25571 Pain in right ankle and joints of right foot: Secondary | ICD-10-CM

## 2019-11-09 NOTE — Progress Notes (Signed)
Office Visit Note   Patient: Felicia Petersen           Date of Birth: 1951/07/31           MRN: [id number] Visit Date: 11/09/2019              Requested by: Laurann Montana, MD [id number] [place],  [place] [address] PCP: Laurann Montana, MD   Assessment & Plan: Visit Diagnoses:  1. Pain in right ankle and joints of right foot     Plan: Combination of factors creating right foot pain.  I did see Felicia Petersen several years ago for similar problem.  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  She is also having some pain behind both malleolar line and could have some partial tearing of one or both peroneal tendons and possibly the same with the posterior tibial tendon.  She does have flexible flat feet.  She has an Futures trader at home that like her to wear for 3 to 4 weeks and then return for reevaluation.  I would consider an MRI scan at that point I have also provided arch supports which I think will help with the midfoot arthritis  Follow-Up Instructions: Return if symptoms worsen or fail to improve.   Orders:  Orders Placed This Encounter  Procedures  . XR Foot 2 Views Right  . XR Ankle 2 Views Right   No orders of the defined types were placed in this encounter.     Procedures: No procedures performed   Clinical Data: No additional findings.   Subjective: Chief Complaint  Patient presents with  . Right Foot - Pain  Patient presents today for right foot pain. No known injury. Her foot has been hurting for 9 days. She points to all around her ankle and under the pad of her foot. Her pain is constant. Prolonged weightbearing causes more pain. She takes Aleve. She has tried soaking it in Epsom salt and applying heat or ice. No previous surgery to that foot or ankle.  Seen several years ago for similar problem.  She was provided with arch supports and was told to use equalizer boot as she had a combination of factors causing her pain.  At that time it was  mostly the midfoot arthritis.  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  She did well with the immobilization for period of time only to have some recurrent pain recently without injury or trauma has had prior triple coronary artery bypass with vein harvesting from the right leg  HPI  Review of Systems   Objective: Vital Signs: Ht 5\' 4"  (1.626 m)   Wt 242 lb (109.8 kg)   BMI 41.54 kg/m   Physical Exam Constitutional:      Appearance: She is well-developed.  Eyes:     Pupils: Pupils are equal, round, and reactive to light.  Pulmonary:     Effort: Pulmonary effort is normal.  Skin:    General: Skin is warm and dry.  Neurological:     Mental Status: She is alert and oriented to person, place, and time.  Psychiatric:        Behavior: Behavior normal.     Ortho Exam right foot and ankle with good pulses.  Motor exam intact.  Prior surgical incision between third and fourth toes for an apparent neuroma excision.  Also of multiple incisions along the medial aspect of her leg from the vein harvesting for  her coronary artery bypass.  There is some tenderness behind the medial and lateral malleolar I with some mild swelling.  Both peroneal tendons and posterior tibial tendon function appear to be intact.  No swelling of the foot.  Certainly has flexible flat feet no pain over the os calcis posteriorly or plantarly.  +2 pulses.  Good sensation.  Some mild swelling diffusely about the ankle.  No malleolar pain  Specialty Comments:  No specialty comments available.  Imaging: XR Ankle 2 Views Right  Result Date: 11/09/2019 Films of the right ankle obtained in several projections.  There are number of small surgical clips medially from prior vein harvesting.  There is a little narrowing at the medial ankle joint with a small cyst formation in the talus otherwise ankle mortise appears to be intact.  No acute changes  XR Foot 2 Views Right  Result Date:  11/09/2019 Films of the right foot were obtained in several projections.  There are large posterior and plantar heel spurs.  There are midfoot changes of arthritis with sagging of the navicular and osteophytes at the talonavicular and navicular first cuneiform joints.  There is some beaking of the talus at that level.  No acute changes.  There appeared to be a little widening between the bases of the fourth and fifth metatarsals but no history of left Lisfranc's injury or recent injury and is no pain in that area.    PMFS History: Patient Active Problem List   Diagnosis Date Noted  . Pain in right ankle and joints of right foot 11/09/2019  . Obstructive sleep apnea 06/05/2017  . Coronary artery disease involving coronary bypass graft of native heart with angina pectoris ([place]) 06/04/2017  . Hyperlipidemia LDL goal <70 06/04/2017  . Essential hypertension 06/04/2017   Past Medical History:  Diagnosis Date  . Hyperlipidemia   . Hypertension     Family History  Problem Relation Age of Onset  . Cancer Mother   . Heart disease Father   . Cancer Sister   . Heart disease Sister   . Diabetes Brother   . Breast cancer Maternal Aunt     Past Surgical History:  Procedure Laterality Date  . BREAST EXCISIONAL BIOPSY Right   . BYPASS GRAFT     triple bypass  . REPLACEMENT TOTAL KNEE Left    Social History   Occupational History  . Not on file  Tobacco Use  . Smoking status: Former Research scientist (life sciences)  . Smokeless tobacco: Never Used  Substance and Sexual Activity  . Alcohol use: No  . Drug use: No  . Sexual activity: Never

## 2019-11-18 DIAGNOSIS — M170 Bilateral primary osteoarthritis of knee: Secondary | ICD-10-CM | POA: Diagnosis not present

## 2019-11-18 DIAGNOSIS — I10 Essential (primary) hypertension: Secondary | ICD-10-CM | POA: Diagnosis not present

## 2019-11-18 DIAGNOSIS — Z0000 Encounter for general adult medical examination without abnormal findings: Secondary | ICD-10-CM | POA: Diagnosis not present

## 2019-11-18 DIAGNOSIS — I2510 Atherosclerotic heart disease of native coronary artery without angina pectoris: Secondary | ICD-10-CM | POA: Diagnosis not present

## 2019-11-18 DIAGNOSIS — G4733 Obstructive sleep apnea (adult) (pediatric): Secondary | ICD-10-CM | POA: Diagnosis not present

## 2019-11-18 DIAGNOSIS — F5101 Primary insomnia: Secondary | ICD-10-CM | POA: Diagnosis not present

## 2019-11-18 DIAGNOSIS — E785 Hyperlipidemia, unspecified: Secondary | ICD-10-CM | POA: Diagnosis not present

## 2019-12-26 DIAGNOSIS — Z23 Encounter for immunization: Secondary | ICD-10-CM | POA: Diagnosis not present

## 2019-12-28 DIAGNOSIS — G4733 Obstructive sleep apnea (adult) (pediatric): Secondary | ICD-10-CM | POA: Diagnosis not present

## 2019-12-28 DIAGNOSIS — I2510 Atherosclerotic heart disease of native coronary artery without angina pectoris: Secondary | ICD-10-CM | POA: Diagnosis not present

## 2019-12-28 DIAGNOSIS — I10 Essential (primary) hypertension: Secondary | ICD-10-CM | POA: Diagnosis not present

## 2020-04-04 ENCOUNTER — Other Ambulatory Visit: Payer: Self-pay | Admitting: Obstetrics and Gynecology

## 2020-04-04 DIAGNOSIS — Z1231 Encounter for screening mammogram for malignant neoplasm of breast: Secondary | ICD-10-CM

## 2020-04-16 ENCOUNTER — Ambulatory Visit: Payer: Medicare HMO

## 2020-04-30 ENCOUNTER — Other Ambulatory Visit: Payer: Self-pay

## 2020-04-30 ENCOUNTER — Ambulatory Visit
Admission: RE | Admit: 2020-04-30 | Discharge: 2020-04-30 | Disposition: A | Discharge status: Home / Self Care | Payer: Medicare HMO | Source: Ambulatory Visit | Attending: Obstetrics and Gynecology | Admitting: Obstetrics and Gynecology

## 2020-04-30 DIAGNOSIS — Z1231 Encounter for screening mammogram for malignant neoplasm of breast: Secondary | ICD-10-CM | POA: Diagnosis not present

## 2020-05-15 DIAGNOSIS — I10 Essential (primary) hypertension: Secondary | ICD-10-CM | POA: Diagnosis not present

## 2020-05-15 DIAGNOSIS — E559 Vitamin D deficiency, unspecified: Secondary | ICD-10-CM | POA: Diagnosis not present

## 2020-05-15 DIAGNOSIS — R519 Headache, unspecified: Secondary | ICD-10-CM | POA: Diagnosis not present

## 2020-05-15 DIAGNOSIS — K439 Ventral hernia without obstruction or gangrene: Secondary | ICD-10-CM | POA: Diagnosis not present

## 2020-05-15 DIAGNOSIS — F5101 Primary insomnia: Secondary | ICD-10-CM | POA: Diagnosis not present

## 2020-05-15 DIAGNOSIS — E785 Hyperlipidemia, unspecified: Secondary | ICD-10-CM | POA: Diagnosis not present

## 2020-05-30 DIAGNOSIS — H353131 Nonexudative age-related macular degeneration, bilateral, early dry stage: Secondary | ICD-10-CM | POA: Diagnosis not present

## 2020-05-30 DIAGNOSIS — H40013 Open angle with borderline findings, low risk, bilateral: Secondary | ICD-10-CM | POA: Diagnosis not present

## 2020-05-30 DIAGNOSIS — H2513 Age-related nuclear cataract, bilateral: Secondary | ICD-10-CM | POA: Diagnosis not present

## 2020-05-30 DIAGNOSIS — H25013 Cortical age-related cataract, bilateral: Secondary | ICD-10-CM | POA: Diagnosis not present

## 2020-06-06 DIAGNOSIS — Z23 Encounter for immunization: Secondary | ICD-10-CM | POA: Diagnosis not present

## 2020-07-12 DIAGNOSIS — G4733 Obstructive sleep apnea (adult) (pediatric): Secondary | ICD-10-CM | POA: Diagnosis not present

## 2020-07-20 DIAGNOSIS — K432 Incisional hernia without obstruction or gangrene: Secondary | ICD-10-CM | POA: Diagnosis not present

## 2020-07-26 ENCOUNTER — Other Ambulatory Visit: Payer: Self-pay | Admitting: General Surgery

## 2020-07-26 DIAGNOSIS — K432 Incisional hernia without obstruction or gangrene: Secondary | ICD-10-CM

## 2020-08-09 ENCOUNTER — Other Ambulatory Visit: Payer: Self-pay

## 2020-08-09 ENCOUNTER — Ambulatory Visit
Admission: RE | Admit: 2020-08-09 | Discharge: 2020-08-09 | Disposition: A | Discharge status: Home / Self Care | Payer: Medicare HMO | Source: Ambulatory Visit | Attending: General Surgery | Admitting: General Surgery

## 2020-08-09 DIAGNOSIS — R109 Unspecified abdominal pain: Secondary | ICD-10-CM | POA: Diagnosis not present

## 2020-08-09 DIAGNOSIS — K432 Incisional hernia without obstruction or gangrene: Secondary | ICD-10-CM

## 2020-08-09 DIAGNOSIS — K5730 Diverticulosis of large intestine without perforation or abscess without bleeding: Secondary | ICD-10-CM | POA: Diagnosis not present

## 2020-09-24 NOTE — Progress Notes (Deleted)
Cardiology Office Note:    Date:  09/24/2020   ID:  Felicia Petersen, DOB 1951/06/30, MRN [id number]  PCP:  Laurann Montana, MD  Cardiologist:  Lesleigh Noe, MD   Referring MD: Laurann Montana, MD   No chief complaint on file.   History of Present Illness:    Felicia Petersen is a 70 y.o. female with a hx of chest pain and dyspnea,prior bypass grafting in 2000 with LIMA to LAD, SVG to ramus, and SVG to OM,hyperlipidemia, hypertension, and obstructive sleep apnea.   ***  Past Medical History:  Diagnosis Date  . Hyperlipidemia   . Hypertension     Past Surgical History:  Procedure Laterality Date  . BREAST EXCISIONAL BIOPSY Right   . BYPASS GRAFT     triple bypass  . REPLACEMENT TOTAL KNEE Left     Current Medications: No outpatient medications have been marked as taking for the 09/28/20 encounter (Appointment) with Lyn Records, MD.     Allergies:   Fish allergy and Penicillins   Social History   Socioeconomic History  . Marital status: Single    Spouse name: Not on file  . Number of children: Not on file  . Years of education: Not on file  . Highest education level: Not on file  Occupational History  . Not on file  Tobacco Use  . Smoking status: Former Games developer  . Smokeless tobacco: Never Used  Vaping Use  . Vaping Use: Never used  Substance and Sexual Activity  . Alcohol use: No  . Drug use: No  . Sexual activity: Never  Other Topics Concern  . Not on file  Social History Narrative  . Not on file   Social Determinants of Health   Financial Resource Strain: Not on file  Food Insecurity: Not on file  Transportation Needs: Not on file  Physical Activity: Not on file  Stress: Not on file  Social Connections: Not on file     Family History: The patient's family history includes Breast cancer in her maternal aunt; Cancer in her mother and sister; Diabetes in her brother; Heart disease in her father and sister.  ROS:   Please see the history of  present illness.    *** All other systems reviewed and are negative.  EKGs/Labs/Other Studies Reviewed:    The following studies were reviewed today:  ECHOCARDIOGRAM 2020: IMPRESSIONS    1. Left ventricular ejection fraction is 60% by calculated 3D EF. The  left ventricle has normal function. There is mildly increased left  ventricular hypertrophy.  2. Elevated left ventricular end-diastolic pressure.  3. Left ventricular diastolic parameters are consistent with Grade II  diastolic dysfunction (pseudonormalization).  4. The left ventricle has no regional wall motion abnormalities.  5. Global right ventricle has normal systolic function.The right  ventricular size is normal. No increase in right ventricular wall  thickness.  6. Moderately elevated pulmonary artery systolic pressure.  7. Left atrial size was moderately dilated.  8. Right atrial size was normal.  9. The mitral valve is normal in structure. Trivial mitral valve  regurgitation. No evidence of mitral stenosis.  10. The tricuspid valve is normal in structure. Tricuspid valve  regurgitation is mild.  11. The aortic valve is tricuspid. Aortic valve regurgitation is not  visualized. Mild aortic valve sclerosis without stenosis.  12. Aortic valve mean gradient measures 7.0 mmHg.  13. The pulmonic valve was normal in structure. Pulmonic valve  regurgitation is trivial.  14. The  inferior vena cava is normal in size with greater than 50%  respiratory variability, suggesting right atrial pressure of 3 mmHg.  15. The average left ventricular global longitudinal strain is -19.5 %.   EKG:  EKG ***  Recent Labs: No results found for requested labs within last 8760 hours.  Recent Lipid Panel    Component Value Date/Time   CHOL 129 06/28/2018 0828   TRIG 62 06/28/2018 0828   HDL 45 06/28/2018 0828   CHOLHDL 2.9 06/28/2018 0828   LDLCALC 72 06/28/2018 0828    Physical Exam:    VS:  There were no vitals taken  for this visit.    Wt Readings from Last 3 Encounters:  11/09/19 242 lb (109.8 kg)  08/24/19 241 lb 12.8 oz (109.7 kg)  04/13/19 239 lb (108.4 kg)     GEN: ***. No acute distress HEENT: Normal NECK: No JVD. LYMPHATICS: No lymphadenopathy CARDIAC: *** murmur. RRR *** gallop, or edema. VASCULAR: *** Normal Pulses. No bruits. RESPIRATORY:  Clear to auscultation without rales, wheezing or rhonchi  ABDOMEN: Soft, non-tender, non-distended, No pulsatile mass, MUSCULOSKELETAL: No deformity  SKIN: Warm and dry NEUROLOGIC:  Alert and oriented x 3 PSYCHIATRIC:  Normal affect   ASSESSMENT:    1. Coronary artery disease involving coronary bypass graft of native heart with angina pectoris ([facility])   2. Essential hypertension   3. Hyperlipidemia LDL goal <70   4. Obstructive sleep apnea   5. Educated about COVID-19 virus infection   6. Systolic murmur    PLAN:    In order of problems listed above:  1. ***   Medication Adjustments/Labs and Tests Ordered: Current medicines are reviewed at length with the patient today.  Concerns regarding medicines are outlined above.  No orders of the defined types were placed in this encounter.  No orders of the defined types were placed in this encounter.   There are no Patient Instructions on file for this visit.   Signed, Lesleigh Noe, MD  09/24/2020 5:39 PM    [facility]

## 2020-09-28 ENCOUNTER — Ambulatory Visit: Payer: Medicare HMO | Admitting: Interventional Cardiology

## 2020-09-28 DIAGNOSIS — Z7189 Other specified counseling: Secondary | ICD-10-CM

## 2020-09-28 DIAGNOSIS — I25709 Atherosclerosis of coronary artery bypass graft(s), unspecified, with unspecified angina pectoris: Secondary | ICD-10-CM

## 2020-09-28 DIAGNOSIS — R011 Cardiac murmur, unspecified: Secondary | ICD-10-CM

## 2020-09-28 DIAGNOSIS — G4733 Obstructive sleep apnea (adult) (pediatric): Secondary | ICD-10-CM

## 2020-09-28 DIAGNOSIS — I10 Essential (primary) hypertension: Secondary | ICD-10-CM

## 2020-09-28 DIAGNOSIS — E785 Hyperlipidemia, unspecified: Secondary | ICD-10-CM

## 2020-10-11 ENCOUNTER — Other Ambulatory Visit: Payer: Self-pay | Admitting: Interventional Cardiology

## 2020-11-26 DIAGNOSIS — I700 Atherosclerosis of aorta: Secondary | ICD-10-CM | POA: Diagnosis not present

## 2020-11-26 DIAGNOSIS — F5101 Primary insomnia: Secondary | ICD-10-CM | POA: Diagnosis not present

## 2020-11-26 DIAGNOSIS — E559 Vitamin D deficiency, unspecified: Secondary | ICD-10-CM | POA: Diagnosis not present

## 2020-11-26 DIAGNOSIS — F439 Reaction to severe stress, unspecified: Secondary | ICD-10-CM | POA: Diagnosis not present

## 2020-11-26 DIAGNOSIS — K5790 Diverticulosis of intestine, part unspecified, without perforation or abscess without bleeding: Secondary | ICD-10-CM | POA: Diagnosis not present

## 2020-11-26 DIAGNOSIS — I10 Essential (primary) hypertension: Secondary | ICD-10-CM | POA: Diagnosis not present

## 2020-11-26 DIAGNOSIS — E785 Hyperlipidemia, unspecified: Secondary | ICD-10-CM | POA: Diagnosis not present

## 2020-11-26 DIAGNOSIS — Z0000 Encounter for general adult medical examination without abnormal findings: Secondary | ICD-10-CM | POA: Diagnosis not present

## 2021-01-02 DIAGNOSIS — G4733 Obstructive sleep apnea (adult) (pediatric): Secondary | ICD-10-CM | POA: Diagnosis not present

## 2021-01-12 ENCOUNTER — Other Ambulatory Visit: Payer: Self-pay | Admitting: Interventional Cardiology

## 2021-01-13 NOTE — Progress Notes (Signed)
Cardiology Office Note:    Date:  01/14/2021   ID:  MEKIA DIPINTO, DOB May 18, 1951, MRN [id number]  PCP:  Laurann Montana, MD  Cardiologist:  Lesleigh Noe, MD   Referring MD: Laurann Montana, MD   Chief Complaint  Patient presents with  . Coronary Artery Disease  . Cardiac Valve Problem    History of Present Illness:    Felicia Petersen is a 70 y.o. female with a hx of chest pain and dyspnea,prior bypass grafting in 2000 with LIMA to LAD, SVG to ramus, and SVG to OM,hyperlipidemia, hypertension, and obstructive sleep apnea.   Walking 3-4 times per week greater than 40 minutes on each occasion.  Much other activity in a typical week.  No anginal symptoms or dyspnea.  Denies palpitations, syncope, and edema.  No orthopnea.  Some difficulty sleeping.  Not 100% compliant with CPAP.  Past Medical History:  Diagnosis Date  . Hyperlipidemia   . Hypertension     Past Surgical History:  Procedure Laterality Date  . BREAST EXCISIONAL BIOPSY Right   . BYPASS GRAFT     triple bypass  . REPLACEMENT TOTAL KNEE Left     Current Medications: Current Meds  Medication Sig  . amLODipine (NORVASC) 10 MG tablet Take 10 mg by mouth daily.  Marland Kitchen aspirin 81 MG tablet Take 81 mg by mouth daily.  Marland Kitchen atenolol (TENORMIN) 50 MG tablet Take 50 mg by mouth daily.  . Cholecalciferol (VITAMIN D-3) 1000 UNITS CAPS Take by mouth.  . furosemide (LASIX) 40 MG tablet Take 40 mg by mouth.  . Multiple Vitamin (MULTIVITAMIN) tablet Take 1 tablet by mouth daily.  . Multiple Vitamins-Minerals (PRESERVISION AREDS) CAPS Take 1 capsule by mouth daily.  Marland Kitchen olmesartan (BENICAR) 40 MG tablet Take 40 mg by mouth daily.  . potassium chloride SA (KLOR-CON) 20 MEQ tablet Take 20 mEq by mouth daily.  . rosuvastatin (CRESTOR) 20 MG tablet Take 1 tablet (20 mg total) by mouth daily. Please keep upcoming appt in April 2022 with Dr. Katrinka Blazing before anymore refills. Thank you  . Turmeric (QC TUMERIC COMPLEX PO) Take by mouth.      Allergies:   Fish allergy, Penicillins, and Trazodone hcl   Social History   Socioeconomic History  . Marital status: Single    Spouse name: Not on file  . Number of children: Not on file  . Years of education: Not on file  . Highest education level: Not on file  Occupational History  . Not on file  Tobacco Use  . Smoking status: Former Games developer  . Smokeless tobacco: Never Used  Vaping Use  . Vaping Use: Never used  Substance and Sexual Activity  . Alcohol use: No  . Drug use: No  . Sexual activity: Never  Other Topics Concern  . Not on file  Social History Narrative  . Not on file   Social Determinants of Health   Financial Resource Strain: Not on file  Food Insecurity: Not on file  Transportation Needs: Not on file  Physical Activity: Not on file  Stress: Not on file  Social Connections: Not on file     Family History: The patient's family history includes Breast cancer in her maternal aunt; Cancer in her mother and sister; Diabetes in her brother; Heart disease in her father and sister.  ROS:   Please see the history of present illness.    Compliant with medications.  Some difficulty sleeping.  Tries hard to be compliant with  CPAP.  All other systems reviewed and are negative.  EKGs/Labs/Other Studies Reviewed:    The following studies were reviewed today:  ECHOCARDIOGRAPHY 2020: IMPRESSIONS    1. Left ventricular ejection fraction is 60% by calculated 3D EF. The  left ventricle has normal function. There is mildly increased left  ventricular hypertrophy.  2. Elevated left ventricular end-diastolic pressure.  3. Left ventricular diastolic parameters are consistent with Grade II  diastolic dysfunction (pseudonormalization).  4. The left ventricle has no regional wall motion abnormalities.  5. Global right ventricle has normal systolic function.The right  ventricular size is normal. No increase in right ventricular wall  thickness.  6.  Moderately elevated pulmonary artery systolic pressure.  7. Left atrial size was moderately dilated.  8. Right atrial size was normal.  9. The mitral valve is normal in structure. Trivial mitral valve  regurgitation. No evidence of mitral stenosis.  10. The tricuspid valve is normal in structure. Tricuspid valve  regurgitation is mild.  11. The aortic valve is tricuspid. Aortic valve regurgitation is not  visualized. Mild aortic valve sclerosis without stenosis.  12. Aortic valve mean gradient measures 7.0 mmHg.  13. The pulmonic valve was normal in structure. Pulmonic valve  regurgitation is trivial.  14. The inferior vena cava is normal in size with greater than 50%  respiratory variability, suggesting right atrial pressure of 3 mmHg.  15. The average left ventricular global longitudinal strain is -19.5 %.   EKG:  EKG sinus bradycardia.  First-degree AV block.  Recent Labs: No results found for requested labs within last 8760 hours.  Recent Lipid Panel    Component Value Date/Time   CHOL 129 06/28/2018 0828   TRIG 62 06/28/2018 0828   HDL 45 06/28/2018 0828   CHOLHDL 2.9 06/28/2018 0828   LDLCALC 72 06/28/2018 0828    Physical Exam:    VS:  BP 136/70   Pulse (!) 51   Ht 5\' 4"  (1.626 m)   Wt 230 lb (104.3 kg)   SpO2 98%   BMI 39.48 kg/m     Wt Readings from Last 3 Encounters:  01/14/21 230 lb (104.3 kg)  11/09/19 242 lb (109.8 kg)  08/24/19 241 lb 12.8 oz (109.7 kg)     GEN: Obese. No acute distress HEENT: Normal NECK: No JVD. LYMPHATICS: No lymphadenopathy CARDIAC: 2/6 systolic crescendo decrescendo right upper sternal and subclavicular systolic murmur. RRR no gallop, or edema. VASCULAR: Normal Pulses. No bruits. RESPIRATORY:  Clear to auscultation without rales, wheezing or rhonchi  ABDOMEN: Soft, non-tender, non-distended, No pulsatile mass, MUSCULOSKELETAL: No deformity  SKIN: Warm and dry NEUROLOGIC:  Alert and oriented x 3 PSYCHIATRIC:  Normal affect    ASSESSMENT:    1. Coronary artery disease involving coronary bypass graft of native heart with angina pectoris (HCC)   2. Essential hypertension   3. Hyperlipidemia LDL goal <70   4. Obstructive sleep apnea   5. Systolic murmur    PLAN:    In order of problems listed above:  1. No symptoms of angina and the patient is having ample physical activity achieving greater than 150 minutes/week. 2. Reviewed low-sodium diet. 3. Target LDL less than 70. 4. Encourage compliance with CPAP. 5. Developed an aortic stenosis.  Please see echo report above.  We will do an echo prior to next year's office visit.  .Overall education and awareness concerning primary/secondary risk prevention was discussed in detail: LDL less than 70, hemoglobin A1c less than 7, blood pressure target less  than 130/80 mmHg, >150 minutes of moderate aerobic activity per week, avoidance of smoking, weight control (via diet and exercise), and continued surveillance/management of/for obstructive sleep apnea.    Medication Adjustments/Labs and Tests Ordered: Current medicines are reviewed at length with the patient today.  Concerns regarding medicines are outlined above.  Orders Placed This Encounter  Procedures  . EKG 12-Lead  . ECHOCARDIOGRAM COMPLETE   No orders of the defined types were placed in this encounter.   Patient Instructions  Medication Instructions:  Your physician recommends that you continue on your current medications as directed. Please refer to the Current Medication list given to you today.  *If you need a refill on your cardiac medications before your next appointment, please call your pharmacy*   Lab Work: None If you have labs (blood work) drawn today and your tests are completely normal, you will receive your results only by: Marland Kitchen MyChart Message (if you have MyChart) OR . A paper copy in the mail If you have any lab test that is abnormal or we need to change your treatment, we will call  you to review the results.   Testing/Procedures: Your physician has requested that you have an echocardiogram 1-2 weeks prior to seeing Dr. Katrinka Blazing back in one year. Echocardiography is a painless test that uses sound waves to create images of your heart. It provides your doctor with information about the size and shape of your heart and how well your heart's chambers and valves are working. This procedure takes approximately one hour. There are no restrictions for this procedure.   Follow-Up: At [facility], you and your health needs are our priority.  As part of our continuing mission to provide you with exceptional heart care, we have created designated Provider Care Teams.  These Care Teams include your primary Cardiologist (physician) and Advanced Practice Providers (APPs -  Physician Assistants and Nurse Practitioners) who all work together to provide you with the care you need, when you need it.  We recommend signing up for the patient portal called "MyChart".  Sign up information is provided on this After Visit Summary.  MyChart is used to connect with patients for Virtual Visits (Telemedicine).  Patients are able to view lab/test results, encounter notes, upcoming appointments, etc.  Non-urgent messages can be sent to your provider as well.   To learn more about what you can do with MyChart, go to [email or web address].    Your next appointment:   1 year(s)  The format for your next appointment:   In Person  Provider:   You may see Lesleigh Noe, MD or one of the following Advanced Practice Providers on your designated Care Team:    Georgie Chard, NP    Other Instructions      Signed, Lesleigh Noe, MD  01/14/2021 8:41 AM    [facility]

## 2021-01-14 ENCOUNTER — Encounter: Payer: Self-pay | Admitting: Interventional Cardiology

## 2021-01-14 ENCOUNTER — Ambulatory Visit: Payer: Medicare HMO | Admitting: Interventional Cardiology

## 2021-01-14 ENCOUNTER — Other Ambulatory Visit: Payer: Self-pay

## 2021-01-14 VITALS — BP 136/70 | HR 51 | Ht 64.0 in | Wt 230.0 lb

## 2021-01-14 DIAGNOSIS — E785 Hyperlipidemia, unspecified: Secondary | ICD-10-CM

## 2021-01-14 DIAGNOSIS — I10 Essential (primary) hypertension: Secondary | ICD-10-CM

## 2021-01-14 DIAGNOSIS — R011 Cardiac murmur, unspecified: Secondary | ICD-10-CM

## 2021-01-14 DIAGNOSIS — I25709 Atherosclerosis of coronary artery bypass graft(s), unspecified, with unspecified angina pectoris: Secondary | ICD-10-CM | POA: Diagnosis not present

## 2021-01-14 DIAGNOSIS — G4733 Obstructive sleep apnea (adult) (pediatric): Secondary | ICD-10-CM | POA: Diagnosis not present

## 2021-01-14 NOTE — Patient Instructions (Signed)
Medication Instructions:  Your physician recommends that you continue on your current medications as directed. Please refer to the Current Medication list given to you today.  *If you need a refill on your cardiac medications before your next appointment, please call your pharmacy*   Lab Work: None If you have labs (blood work) drawn today and your tests are completely normal, you will receive your results only by: Marland Kitchen MyChart Message (if you have MyChart) OR . A paper copy in the mail If you have any lab test that is abnormal or we need to change your treatment, we will call you to review the results.   Testing/Procedures: Your physician has requested that you have an echocardiogram 1-2 weeks prior to seeing Dr. Katrinka Blazing back in one year. Echocardiography is a painless test that uses sound waves to create images of your heart. It provides your doctor with information about the size and shape of your heart and how well your heart's chambers and valves are working. This procedure takes approximately one hour. There are no restrictions for this procedure.   Follow-Up: At [facility], you and your health needs are our priority.  As part of our continuing mission to provide you with exceptional heart care, we have created designated Provider Care Teams.  These Care Teams include your primary Cardiologist (physician) and Advanced Practice Providers (APPs -  Physician Assistants and Nurse Practitioners) who all work together to provide you with the care you need, when you need it.  We recommend signing up for the patient portal called "MyChart".  Sign up information is provided on this After Visit Summary.  MyChart is used to connect with patients for Virtual Visits (Telemedicine).  Patients are able to view lab/test results, encounter notes, upcoming appointments, etc.  Non-urgent messages can be sent to your provider as well.   To learn more about what you can do with MyChart, go to  [email or web address].    Your next appointment:   1 year(s)  The format for your next appointment:   In Person  Provider:   You may see Lesleigh Noe, MD or one of the following Advanced Practice Providers on your designated Care Team:    Georgie Chard, NP    Other Instructions

## 2021-01-21 DIAGNOSIS — G4733 Obstructive sleep apnea (adult) (pediatric): Secondary | ICD-10-CM | POA: Diagnosis not present

## 2021-05-30 DIAGNOSIS — H40013 Open angle with borderline findings, low risk, bilateral: Secondary | ICD-10-CM | POA: Diagnosis not present

## 2021-05-30 DIAGNOSIS — H353131 Nonexudative age-related macular degeneration, bilateral, early dry stage: Secondary | ICD-10-CM | POA: Diagnosis not present

## 2021-05-30 DIAGNOSIS — H2513 Age-related nuclear cataract, bilateral: Secondary | ICD-10-CM | POA: Diagnosis not present

## 2021-05-30 DIAGNOSIS — H04123 Dry eye syndrome of bilateral lacrimal glands: Secondary | ICD-10-CM | POA: Diagnosis not present

## 2021-06-03 DIAGNOSIS — Z23 Encounter for immunization: Secondary | ICD-10-CM | POA: Diagnosis not present

## 2021-06-03 DIAGNOSIS — F5101 Primary insomnia: Secondary | ICD-10-CM | POA: Diagnosis not present

## 2021-06-03 DIAGNOSIS — I10 Essential (primary) hypertension: Secondary | ICD-10-CM | POA: Diagnosis not present

## 2021-06-03 DIAGNOSIS — F419 Anxiety disorder, unspecified: Secondary | ICD-10-CM | POA: Diagnosis not present

## 2021-06-03 DIAGNOSIS — E785 Hyperlipidemia, unspecified: Secondary | ICD-10-CM | POA: Diagnosis not present

## 2021-06-10 ENCOUNTER — Other Ambulatory Visit: Payer: Self-pay | Admitting: Family Medicine

## 2021-06-10 DIAGNOSIS — Z1231 Encounter for screening mammogram for malignant neoplasm of breast: Secondary | ICD-10-CM

## 2021-06-11 DIAGNOSIS — G4733 Obstructive sleep apnea (adult) (pediatric): Secondary | ICD-10-CM | POA: Diagnosis not present

## 2021-07-03 DIAGNOSIS — F419 Anxiety disorder, unspecified: Secondary | ICD-10-CM | POA: Diagnosis not present

## 2021-07-15 ENCOUNTER — Ambulatory Visit
Admission: RE | Admit: 2021-07-15 | Discharge: 2021-07-15 | Disposition: A | Discharge status: Home / Self Care | Payer: Medicare HMO | Source: Ambulatory Visit | Attending: Family Medicine | Admitting: Family Medicine

## 2021-07-15 ENCOUNTER — Other Ambulatory Visit: Payer: Self-pay

## 2021-07-15 DIAGNOSIS — Z1231 Encounter for screening mammogram for malignant neoplasm of breast: Secondary | ICD-10-CM | POA: Diagnosis not present

## 2021-10-01 DIAGNOSIS — M79642 Pain in left hand: Secondary | ICD-10-CM | POA: Diagnosis not present

## 2021-10-08 ENCOUNTER — Ambulatory Visit: Payer: Medicare HMO | Admitting: Orthopaedic Surgery

## 2021-10-22 DIAGNOSIS — M180 Bilateral primary osteoarthritis of first carpometacarpal joints: Secondary | ICD-10-CM | POA: Diagnosis not present

## 2021-12-05 DIAGNOSIS — Z0000 Encounter for general adult medical examination without abnormal findings: Secondary | ICD-10-CM | POA: Diagnosis not present

## 2021-12-05 DIAGNOSIS — F419 Anxiety disorder, unspecified: Secondary | ICD-10-CM | POA: Diagnosis not present

## 2021-12-05 DIAGNOSIS — K625 Hemorrhage of anus and rectum: Secondary | ICD-10-CM | POA: Diagnosis not present

## 2021-12-05 DIAGNOSIS — I2510 Atherosclerotic heart disease of native coronary artery without angina pectoris: Secondary | ICD-10-CM | POA: Diagnosis not present

## 2021-12-05 DIAGNOSIS — I700 Atherosclerosis of aorta: Secondary | ICD-10-CM | POA: Diagnosis not present

## 2021-12-05 DIAGNOSIS — I10 Essential (primary) hypertension: Secondary | ICD-10-CM | POA: Diagnosis not present

## 2021-12-05 DIAGNOSIS — F5101 Primary insomnia: Secondary | ICD-10-CM | POA: Diagnosis not present

## 2021-12-05 DIAGNOSIS — E785 Hyperlipidemia, unspecified: Secondary | ICD-10-CM | POA: Diagnosis not present

## 2022-01-09 DIAGNOSIS — G4733 Obstructive sleep apnea (adult) (pediatric): Secondary | ICD-10-CM | POA: Diagnosis not present

## 2022-01-09 DIAGNOSIS — I10 Essential (primary) hypertension: Secondary | ICD-10-CM | POA: Diagnosis not present

## 2022-01-13 ENCOUNTER — Ambulatory Visit (HOSPITAL_COMMUNITY): Payer: Medicare HMO | Attending: Internal Medicine

## 2022-01-13 DIAGNOSIS — R011 Cardiac murmur, unspecified: Secondary | ICD-10-CM | POA: Insufficient documentation

## 2022-01-13 LAB — ECHOCARDIOGRAM COMPLETE
Area-P 1/2: 3.91 cm2
S' Lateral: 3.1 cm

## 2022-01-17 ENCOUNTER — Telehealth: Payer: Self-pay | Admitting: Interventional Cardiology

## 2022-01-17 NOTE — Telephone Encounter (Signed)
The patient has been notified of the result and verbalized understanding.  All questions (if any) were answered. ?Ethelda Chick, RN 01/17/2022 4:28 PM   ?

## 2022-01-17 NOTE — Telephone Encounter (Signed)
Pt returning nurses call regarding ECHO results. Please advise 

## 2022-01-23 DIAGNOSIS — K625 Hemorrhage of anus and rectum: Secondary | ICD-10-CM | POA: Diagnosis not present

## 2022-02-22 DIAGNOSIS — M1711 Unilateral primary osteoarthritis, right knee: Secondary | ICD-10-CM | POA: Diagnosis not present

## 2022-02-22 DIAGNOSIS — M25561 Pain in right knee: Secondary | ICD-10-CM | POA: Diagnosis not present

## 2022-03-02 NOTE — Progress Notes (Unsigned)
Cardiology Office Note:    Date:  03/03/2022   ID:  Felicia SageBrenda D Sunday, DOB 06/15/1951, MRN [id number]  PCP:  Laurann MontanaWhite, Cynthia, MD  Cardiologist:  Lesleigh NoeHenry W Naphtali Zywicki III, MD   Referring MD: Laurann MontanaWhite, Cynthia, MD   Chief Complaint  Patient presents with   Coronary Artery Disease   Hyperlipidemia   Hypertension    History of Present Illness:    Felicia Petersen is a 71 y.o. female with a hx of chest pain and dyspnea,  prior bypass grafting in 2000 with LIMA to LAD, SVG to ramus, and SVG to OM, hyperlipidemia, hypertension, and obstructive sleep apnea.   No CV complaints.  Denies orthopnea, PND, and edema.  No episodes of syncope and no palpitations.  Past Medical History:  Diagnosis Date   Hyperlipidemia    Hypertension     Past Surgical History:  Procedure Laterality Date   BREAST EXCISIONAL BIOPSY Right    BYPASS GRAFT     triple bypass   REPLACEMENT TOTAL KNEE Left     Current Medications: Current Meds  Medication Sig   aspirin 81 MG tablet Take 81 mg by mouth daily.   atenolol (TENORMIN) 50 MG tablet Take 50 mg by mouth daily.   Cholecalciferol (VITAMIN D-3) 1000 UNITS CAPS Take by mouth.   Diclofenac Sodium 3 % GEL as needed.   furosemide (LASIX) 40 MG tablet Take 40 mg by mouth.   Multiple Vitamin (MULTIVITAMIN) tablet Take 1 tablet by mouth daily.   Multiple Vitamins-Minerals (PRESERVISION AREDS) CAPS Take 1 capsule by mouth daily.   olmesartan (BENICAR) 40 MG tablet Take 40 mg by mouth daily.   potassium chloride SA (KLOR-CON) 20 MEQ tablet Take 20 mEq by mouth daily.   rosuvastatin (CRESTOR) 40 MG tablet Take 1 tablet (40 mg total) by mouth daily.   Turmeric (QC TUMERIC COMPLEX PO) Take by mouth.   [DISCONTINUED] amLODipine (NORVASC) 10 MG tablet Take 10 mg by mouth daily.   [DISCONTINUED] rosuvastatin (CRESTOR) 20 MG tablet Take 1 tablet (20 mg total) by mouth daily.     Allergies:   Escitalopram, Fish allergy, Penicillamine, Penicillins, and Trazodone hcl   Social  History   Socioeconomic History   Marital status: Single    Spouse name: Not on file   Number of children: Not on file   Years of education: Not on file   Highest education level: Not on file  Occupational History   Not on file  Tobacco Use   Smoking status: Former   Smokeless tobacco: Never  Vaping Use   Vaping Use: Never used  Substance and Sexual Activity   Alcohol use: No   Drug use: No   Sexual activity: Never  Other Topics Concern   Not on file  Social History Narrative   Not on file   Social Determinants of Health   Financial Resource Strain: Not on file  Food Insecurity: Not on file  Transportation Needs: Not on file  Physical Activity: Not on file  Stress: Not on file  Social Connections: Not on file     Family History: The patient's family history includes Breast cancer in her maternal aunt; Cancer in her mother and sister; Diabetes in her brother; Heart disease in her father and sister.  ROS:   Please see the history of present illness.    Left knee replacement.  Right knee is not giving her trouble.  This limits her ability to exercise.  Has not been walking as much as  prior.  All other systems reviewed and are negative.  EKGs/Labs/Other Studies Reviewed:    The following studies were reviewed today: 2 D Doppler ECHOCARDIOGRAM 01/13/2021 IMPRESSIONS   1. Global longitudinal strain is -22.5%. Left ventricular ejection  fraction, by estimation, is 60 to 65%. The left ventricle has normal  function. The left ventricle has no regional wall motion abnormalities.  There is mild left ventricular hypertrophy.  Left ventricular diastolic parameters were normal.   2. Right ventricular systolic function is normal. The right ventricular  size is normal. There is normal pulmonary artery systolic pressure.   3. Left atrial size was moderately dilated.   4. The mitral valve is normal in structure. Mild mitral valve  regurgitation.   5. The aortic valve is  tricuspid. Aortic valve regurgitation is not  visualized. Aortic valve sclerosis/calcification is present, without any  evidence of aortic stenosis.   6. The inferior vena cava is normal in size with greater than 50%  respiratory variability, suggesting right atrial pressure of 3 mmHg.   Comparison(s): The left ventricular function is unchanged. 08/31/19 EF 60%.   EKG:  EKG sinus bradycardia, prominent voltage of LVH, borderline first-degree AV block, and when compared to April 2022, no significant changes noted.  Recent Labs: No results found for requested labs within last 365 days.  Recent Lipid Panel    Component Value Date/Time   CHOL 129 06/28/2018 0828   TRIG 62 06/28/2018 0828   HDL 45 06/28/2018 0828   CHOLHDL 2.9 06/28/2018 0828   LDLCALC 72 06/28/2018 0828    Physical Exam:    VS:  BP (!) 142/78   Pulse (!) 55   Ht 5\' 4"  (1.626 m)   Wt 218 lb 3.2 oz (99 kg)   SpO2 98%   BMI 37.45 kg/m     Wt Readings from Last 3 Encounters:  03/03/22 218 lb 3.2 oz (99 kg)  01/14/21 230 lb (104.3 kg)  11/09/19 242 lb (109.8 kg)     GEN: Overweight. No acute distress HEENT: Normal NECK: No JVD. LYMPHATICS: No lymphadenopathy CARDIAC: No murmur. RRR no gallop, or edema. VASCULAR:  Normal Pulses. No bruits. RESPIRATORY:  Clear to auscultation without rales, wheezing or rhonchi  ABDOMEN: Soft, non-tender, non-distended, No pulsatile mass, MUSCULOSKELETAL: No deformity  SKIN: Warm and dry NEUROLOGIC:  Alert and oriented x 3 PSYCHIATRIC:  Normal affect   ASSESSMENT:    1. Coronary artery disease involving coronary bypass graft of native heart with angina pectoris (HCC)   2. Essential hypertension   3. Hyperlipidemia LDL goal <70   4. Obstructive sleep apnea   5. Systolic murmur    PLAN:    In order of problems listed above:  Secondary prevention reviewed.  LDL is too high.  Increase rosuvastatin to 40 mg daily and check liver and lipid panel in 6 to 8 weeks.  1 year  clinical follow-up. Weight reduction and low-salt diet discussed.   Continue to monitor lipids.  Target LDL 55. Continue CPAP compliance. No explanation from her overall echo done a year ago.  Overall education and awareness concerning primary/secondary risk prevention was discussed in detail: LDL less than 70, hemoglobin A1c less than 7, blood pressure target less than 130/80 mmHg, >150 minutes of moderate aerobic activity per week, avoidance of smoking, weight control (via diet and exercise), and continued surveillance/management of/for obstructive sleep apnea.    Medication Adjustments/Labs and Tests Ordered: Current medicines are reviewed at length with the patient today.  Concerns  regarding medicines are outlined above.  Orders Placed This Encounter  Procedures   Lipid panel   Comprehensive metabolic panel   EKG 12-Lead   Meds ordered this encounter  Medications   amLODipine (NORVASC) 10 MG tablet    Sig: Take 1 tablet (10 mg total) by mouth daily.    Dispense:  90 tablet    Refill:  3   rosuvastatin (CRESTOR) 40 MG tablet    Sig: Take 1 tablet (40 mg total) by mouth daily.    Dispense:  90 tablet    Refill:  3    Dose change    Patient Instructions  Medication Instructions:  Your physician has recommended you make the following change in your medication:   1) INCREASE Rosuvastatin to 40mg  daily  *If you need a refill on your cardiac medications before your next appointment, please call your pharmacy*  Lab Work: In 6-8 weeks: fasting Lipid panel, CMET If you have labs (blood work) drawn today and your tests are completely normal, you will receive your results only by: MyChart Message (if you have MyChart) OR A paper copy in the mail If you have any lab test that is abnormal or we need to change your treatment, we will call you to review the results.  Testing/Procedures: NONE  Follow-Up: At [facility], you and your health needs are our priority.  As part of  our continuing mission to provide you with exceptional heart care, we have created designated Provider Care Teams.  These Care Teams include your primary Cardiologist (physician) and Advanced Practice Providers (APPs -  Physician Assistants and Nurse Practitioners) who all work together to provide you with the care you need, when you need it.  Your next appointment:   1 year(s)  The format for your next appointment:   In Person  Provider:   [facility], MD {  Important Information About Sugar         Signed, Lesleigh Noe, MD  03/03/2022 12:31 PM    [facility]

## 2022-03-03 ENCOUNTER — Ambulatory Visit: Payer: Medicare HMO | Admitting: Interventional Cardiology

## 2022-03-03 ENCOUNTER — Encounter: Payer: Self-pay | Admitting: Interventional Cardiology

## 2022-03-03 VITALS — BP 142/78 | HR 55 | Ht 64.0 in | Wt 218.2 lb

## 2022-03-03 DIAGNOSIS — E785 Hyperlipidemia, unspecified: Secondary | ICD-10-CM | POA: Diagnosis not present

## 2022-03-03 DIAGNOSIS — G4733 Obstructive sleep apnea (adult) (pediatric): Secondary | ICD-10-CM

## 2022-03-03 DIAGNOSIS — I10 Essential (primary) hypertension: Secondary | ICD-10-CM

## 2022-03-03 DIAGNOSIS — I25709 Atherosclerosis of coronary artery bypass graft(s), unspecified, with unspecified angina pectoris: Secondary | ICD-10-CM | POA: Diagnosis not present

## 2022-03-03 DIAGNOSIS — R011 Cardiac murmur, unspecified: Secondary | ICD-10-CM | POA: Diagnosis not present

## 2022-03-03 MED ORDER — ROSUVASTATIN CALCIUM 40 MG PO TABS: 40.0000 mg | ORAL_TABLET | Freq: Every day | ORAL | 3 refills | Status: DC

## 2022-03-03 MED ORDER — AMLODIPINE BESYLATE 10 MG PO TABS: 10.0000 mg | ORAL_TABLET | Freq: Every day | ORAL | 3 refills | Status: DC

## 2022-03-03 NOTE — Patient Instructions (Signed)
Medication Instructions:  Your physician has recommended you make the following change in your medication:   1) INCREASE Rosuvastatin to 40mg  daily  *If you need a refill on your cardiac medications before your next appointment, please call your pharmacy*  Lab Work: In 6-8 weeks: fasting Lipid panel, CMET If you have labs (blood work) drawn today and your tests are completely normal, you will receive your results only by: MyChart Message (if you have MyChart) OR A paper copy in the mail If you have any lab test that is abnormal or we need to change your treatment, we will call you to review the results.  Testing/Procedures: NONE  Follow-Up: At [facility], you and your health needs are our priority.  As part of our continuing mission to provide you with exceptional heart care, we have created designated Provider Care Teams.  These Care Teams include your primary Cardiologist (physician) and Advanced Practice Providers (APPs -  Physician Assistants and Nurse Practitioners) who all work together to provide you with the care you need, when you need it.  Your next appointment:   1 year(s)  The format for your next appointment:   In Person  Provider:   [facility], MD {  Important Information About Sugar

## 2022-04-14 ENCOUNTER — Other Ambulatory Visit: Payer: Medicare HMO

## 2022-04-14 DIAGNOSIS — I10 Essential (primary) hypertension: Secondary | ICD-10-CM | POA: Diagnosis not present

## 2022-04-14 DIAGNOSIS — I25709 Atherosclerosis of coronary artery bypass graft(s), unspecified, with unspecified angina pectoris: Secondary | ICD-10-CM

## 2022-04-14 DIAGNOSIS — E785 Hyperlipidemia, unspecified: Secondary | ICD-10-CM | POA: Diagnosis not present

## 2022-04-14 LAB — COMPREHENSIVE METABOLIC PANEL WITH GFR
ALT: 18 IU/L (ref 0–32)
AST: 17 IU/L (ref 0–40)
Albumin/Globulin Ratio: 1.5 (ref 1.2–2.2)
Albumin: 3.8 g/dL (ref 3.8–4.8)
Alkaline Phosphatase: 118 IU/L (ref 44–121)
BUN/Creatinine Ratio: 17 (ref 12–28)
BUN: 13 mg/dL (ref 8–27)
Bilirubin Total: 0.3 mg/dL (ref 0.0–1.2)
CO2: 26 mmol/L (ref 20–29)
Calcium: 9.1 mg/dL (ref 8.7–10.3)
Chloride: 106 mmol/L (ref 96–106)
Creatinine, Ser: 0.75 mg/dL (ref 0.57–1.00)
Globulin, Total: 2.5 g/dL (ref 1.5–4.5)
Glucose: 98 mg/dL (ref 70–99)
Potassium: 3.9 mmol/L (ref 3.5–5.2)
Sodium: 142 mmol/L (ref 134–144)
Total Protein: 6.3 g/dL (ref 6.0–8.5)
eGFR: 85 mL/min/1.73 (ref >59)

## 2022-04-14 LAB — LIPID PANEL
Chol/HDL Ratio: 2.6 ratio (ref 0.0–4.4)
Cholesterol, Total: 138 mg/dL (ref 100–199)
HDL: 53 mg/dL (ref >39)
LDL Chol Calc (NIH): 73 mg/dL (ref 0–99)
Triglycerides: 55 mg/dL (ref 0–149)
VLDL Cholesterol Cal: 12 mg/dL (ref 5–40)

## 2022-05-02 DIAGNOSIS — K625 Hemorrhage of anus and rectum: Secondary | ICD-10-CM | POA: Diagnosis not present

## 2022-05-02 DIAGNOSIS — K5730 Diverticulosis of large intestine without perforation or abscess without bleeding: Secondary | ICD-10-CM | POA: Diagnosis not present

## 2022-05-02 DIAGNOSIS — K635 Polyp of colon: Secondary | ICD-10-CM | POA: Diagnosis not present

## 2022-05-02 DIAGNOSIS — K648 Other hemorrhoids: Secondary | ICD-10-CM | POA: Diagnosis not present

## 2022-05-08 DIAGNOSIS — K635 Polyp of colon: Secondary | ICD-10-CM | POA: Diagnosis not present

## 2022-06-02 DIAGNOSIS — H353131 Nonexudative age-related macular degeneration, bilateral, early dry stage: Secondary | ICD-10-CM | POA: Diagnosis not present

## 2022-06-02 DIAGNOSIS — H25813 Combined forms of age-related cataract, bilateral: Secondary | ICD-10-CM | POA: Diagnosis not present

## 2022-06-02 DIAGNOSIS — H40013 Open angle with borderline findings, low risk, bilateral: Secondary | ICD-10-CM | POA: Diagnosis not present

## 2022-06-02 DIAGNOSIS — H04123 Dry eye syndrome of bilateral lacrimal glands: Secondary | ICD-10-CM | POA: Diagnosis not present

## 2022-06-10 ENCOUNTER — Other Ambulatory Visit: Payer: Self-pay | Admitting: Family Medicine

## 2022-06-10 DIAGNOSIS — R252 Cramp and spasm: Secondary | ICD-10-CM | POA: Diagnosis not present

## 2022-06-10 DIAGNOSIS — M25522 Pain in left elbow: Secondary | ICD-10-CM | POA: Diagnosis not present

## 2022-06-10 DIAGNOSIS — G4733 Obstructive sleep apnea (adult) (pediatric): Secondary | ICD-10-CM | POA: Diagnosis not present

## 2022-06-10 DIAGNOSIS — E785 Hyperlipidemia, unspecified: Secondary | ICD-10-CM | POA: Diagnosis not present

## 2022-06-10 DIAGNOSIS — F5101 Primary insomnia: Secondary | ICD-10-CM | POA: Diagnosis not present

## 2022-06-10 DIAGNOSIS — E049 Nontoxic goiter, unspecified: Secondary | ICD-10-CM

## 2022-06-10 DIAGNOSIS — I2510 Atherosclerotic heart disease of native coronary artery without angina pectoris: Secondary | ICD-10-CM | POA: Diagnosis not present

## 2022-06-10 DIAGNOSIS — I10 Essential (primary) hypertension: Secondary | ICD-10-CM | POA: Diagnosis not present

## 2022-06-10 DIAGNOSIS — F419 Anxiety disorder, unspecified: Secondary | ICD-10-CM | POA: Diagnosis not present

## 2022-06-10 DIAGNOSIS — I700 Atherosclerosis of aorta: Secondary | ICD-10-CM | POA: Diagnosis not present

## 2022-06-12 ENCOUNTER — Ambulatory Visit
Admission: RE | Admit: 2022-06-12 | Discharge: 2022-06-12 | Disposition: A | Discharge status: Home / Self Care | Payer: Medicare HMO | Source: Ambulatory Visit | Attending: Family Medicine | Admitting: Family Medicine

## 2022-06-12 DIAGNOSIS — E049 Nontoxic goiter, unspecified: Secondary | ICD-10-CM

## 2022-06-12 DIAGNOSIS — E042 Nontoxic multinodular goiter: Secondary | ICD-10-CM | POA: Diagnosis not present

## 2022-06-17 ENCOUNTER — Other Ambulatory Visit: Payer: Self-pay | Admitting: Family Medicine

## 2022-06-17 DIAGNOSIS — E041 Nontoxic single thyroid nodule: Secondary | ICD-10-CM

## 2022-06-17 DIAGNOSIS — E049 Nontoxic goiter, unspecified: Secondary | ICD-10-CM

## 2022-06-18 ENCOUNTER — Other Ambulatory Visit: Payer: Self-pay | Admitting: Family Medicine

## 2022-06-18 DIAGNOSIS — Z1231 Encounter for screening mammogram for malignant neoplasm of breast: Secondary | ICD-10-CM

## 2022-06-23 ENCOUNTER — Other Ambulatory Visit: Payer: Medicare HMO

## 2022-06-26 DIAGNOSIS — M25522 Pain in left elbow: Secondary | ICD-10-CM | POA: Diagnosis not present

## 2022-06-26 DIAGNOSIS — M7712 Lateral epicondylitis, left elbow: Secondary | ICD-10-CM | POA: Diagnosis not present

## 2022-07-16 ENCOUNTER — Ambulatory Visit
Admission: RE | Admit: 2022-07-16 | Discharge: 2022-07-16 | Disposition: A | Discharge status: Home / Self Care | Payer: Medicare HMO | Source: Ambulatory Visit | Attending: Family Medicine | Admitting: Family Medicine

## 2022-07-16 DIAGNOSIS — Z1231 Encounter for screening mammogram for malignant neoplasm of breast: Secondary | ICD-10-CM

## 2022-07-31 ENCOUNTER — Other Ambulatory Visit: Payer: Medicare HMO

## 2022-08-28 ENCOUNTER — Other Ambulatory Visit (HOSPITAL_COMMUNITY)
Admission: RE | Admit: 2022-08-28 | Discharge: 2022-08-28 | Disposition: A | Discharge status: Home / Self Care | Payer: Medicare HMO | Source: Ambulatory Visit | Attending: Family Medicine | Admitting: Family Medicine

## 2022-08-28 ENCOUNTER — Ambulatory Visit
Admission: RE | Admit: 2022-08-28 | Discharge: 2022-08-28 | Disposition: A | Discharge status: Home / Self Care | Payer: Medicare HMO | Source: Ambulatory Visit | Attending: Family Medicine | Admitting: Family Medicine

## 2022-08-28 DIAGNOSIS — E041 Nontoxic single thyroid nodule: Secondary | ICD-10-CM

## 2022-08-28 DIAGNOSIS — E042 Nontoxic multinodular goiter: Secondary | ICD-10-CM | POA: Diagnosis not present

## 2022-08-28 DIAGNOSIS — E0789 Other specified disorders of thyroid: Secondary | ICD-10-CM | POA: Diagnosis not present

## 2022-09-01 LAB — CYTOLOGY - NON PAP

## 2022-09-18 ENCOUNTER — Encounter (HOSPITAL_COMMUNITY): Payer: Self-pay

## 2023-01-22 DIAGNOSIS — I10 Essential (primary) hypertension: Secondary | ICD-10-CM | POA: Diagnosis not present

## 2023-01-22 DIAGNOSIS — G4733 Obstructive sleep apnea (adult) (pediatric): Secondary | ICD-10-CM | POA: Diagnosis not present

## 2023-02-23 ENCOUNTER — Other Ambulatory Visit: Payer: Self-pay

## 2023-02-23 MED ORDER — ROSUVASTATIN CALCIUM 40 MG PO TABS: 40.0000 mg | ORAL_TABLET | Freq: Every day | ORAL | 0 refills | Status: DC

## 2023-03-18 NOTE — Progress Notes (Signed)
Cardiology Office Note   Date:  03/19/2023   ID:  Felicia Petersen, DOB 12-19-1950, MRN [id number]  PCP:  Laurann Montana, MD    No chief complaint on file.  CAD  Wt Readings from Last 3 Encounters:  03/19/23 215 lb 6.4 oz (97.7 kg)  03/03/22 218 lb 3.2 oz (99 kg)  01/14/21 230 lb (104.3 kg)       History of Present Illness: Felicia Petersen is a 72 y.o. female  former patient of Dr. Katrinka Blazing  with a hx of chest pain and dyspnea,  prior bypass grafting (Dr. Tyrone Sage) in 2000 with LIMA to LAD, SVG to ramus, and SVG to OM, hyperlipidemia, hypertension, and obstructive sleep apnea.  Angina was DOE, fatigue. She has lost weight since then.   She has done well since CABG.  No repeat caths.   Denies : Chest pain. Dizziness. Leg edema. Nitroglycerin use. Orthopnea. Palpitations. Paroxysmal nocturnal dyspnea. Shortness of breath. Syncope.    Knee pain is worse on higher dose of rosuvastatin, 40 mg.  She has cut the tablets in half to take 20 mg daily.  No bleeding problems. Trying to get another BP cuff.   Past Medical History:  Diagnosis Date   Hyperlipidemia    Hypertension     Past Surgical History:  Procedure Laterality Date   BREAST EXCISIONAL BIOPSY Right    BYPASS GRAFT     triple bypass   REPLACEMENT TOTAL KNEE Left      Current Outpatient Medications  Medication Sig Dispense Refill   amLODipine (NORVASC) 10 MG tablet Take 1 tablet (10 mg total) by mouth daily. 90 tablet 3   aspirin 81 MG tablet Take 81 mg by mouth daily.     atenolol (TENORMIN) 50 MG tablet Take 50 mg by mouth daily.     Diclofenac Sodium 3 % GEL as needed.     Elderberry 500 MG CAPS Take 500 mg by mouth daily.     furosemide (LASIX) 40 MG tablet Take 40 mg by mouth.     Multiple Vitamin (MULTIVITAMIN) tablet Take 1 tablet by mouth daily.     Multiple Vitamins-Minerals (PRESERVISION AREDS) CAPS Take 1 capsule by mouth daily.     olmesartan (BENICAR) 40 MG tablet Take 40 mg by mouth daily.      potassium chloride SA (KLOR-CON) 20 MEQ tablet Take 20 mEq by mouth daily.     rosuvastatin (CRESTOR) 40 MG tablet Take 1 tablet (40 mg total) by mouth daily. 30 tablet 0   Turmeric (QC TUMERIC COMPLEX PO) Take by mouth.     No current facility-administered medications for this visit.    Allergies:   Escitalopram, Fish allergy, Penicillamine, Penicillins, and Trazodone hcl    Social History:  The patient  reports that she has quit smoking. She has never used smokeless tobacco. She reports that she does not drink alcohol and does not use drugs.   Family History:  The patient's family history includes Breast cancer in her maternal aunt; Cancer in her mother and sister; Diabetes in her brother; Heart disease in her father and sister.    ROS:  Please see the history of present illness.   Otherwise, review of systems are positive for knee pain- limits walking.   All other systems are reviewed and negative.    PHYSICAL EXAM: VS:  BP 124/88   Pulse (!) 55   Ht 5\' 4"  (1.626 m)   Wt 215 lb 6.4 oz (97.7 kg)  SpO2 99%   BMI 36.97 kg/m  , BMI Body mass index is 36.97 kg/m. GEN: Well nourished, well developed, in no acute distress HEENT: normal Neck: no JVD, carotid bruits, or masses Cardiac: RRR; 2/6 early systolic murmur, no rubs, or gallops,no edema  Respiratory:  clear to auscultation bilaterally, normal work of breathing GI: soft, nontender, nondistended, + BS MS: no deformity or atrophy Skin: warm and dry, no rash Neuro:  Strength and sensation are intact Psych: euthymic mood, full affect   EKG:   The ekg ordered today demonstrates NSR, LVH   Recent Labs: 04/14/2022: ALT 18; BUN 13; Creatinine, Ser 0.75; Potassium 3.9; Sodium 142   Lipid Panel    Component Value Date/Time   CHOL 138 04/14/2022 0717   TRIG 55 04/14/2022 0717   HDL 53 04/14/2022 0717   CHOLHDL 2.6 04/14/2022 0717   LDLCALC 73 04/14/2022 0717     Other studies Reviewed: Additional studies/ records that  were reviewed today with results demonstrating: 2023 echo showed: "Global longitudinal strain is -22.5%. Left ventricular ejection  fraction, by estimation, is 60 to 65%. The left ventricle has normal  function. The left ventricle has no regional wall motion abnormalities.  There is mild left ventricular hypertrophy.  Left ventricular diastolic parameters were normal.   2. Right ventricular systolic function is normal. The right ventricular  size is normal. There is normal pulmonary artery systolic pressure.   3. Left atrial size was moderately dilated.   4. The mitral valve is normal in structure. Mild mitral valve  regurgitation.   5. The aortic valve is tricuspid. Aortic valve regurgitation is not  visualized. Aortic valve sclerosis/calcification is present, without any  evidence of aortic stenosis.   6. The inferior vena cava is normal in size with greater than 50%  respiratory variability, suggesting right atrial pressure of 3 mmHg. ".   ASSESSMENT AND PLAN:  CAD: s/p CABG. continue aggressive secondary prevention.  No angina.   HTN: Avoid excessive salt.  Avoid processed foods. Hyperlipidemia: Rosuvastatin increased to 40 mg daily in 2023.  LDL 73 likely on the 40 mg dose of rosuvastatin.  Repeat cholesterol check today and if LDL is above target , will investigate other nonstatin options to help lower the cholesterol. whole food, plant-based diet.  High-fiber diet. OSA: Using CPAP.  Systolic murmur: Aortic sclerosis based on echo in 4/23.  No CHF symptoms.   Morbid obesity: Has lost weight since CABG.   Current medicines are reviewed at length with the patient today.  The patient concerns regarding her medicines were addressed.  The following changes have been made:  No change  Labs/ tests ordered today include:   Orders Placed This Encounter  Procedures   EKG 12-Lead    Recommend 150 minutes/week of aerobic exercise Low fat, low carb, high fiber diet  recommended  Disposition:   FU in 1 year   Signed, Lance Muss, MD  03/19/2023 9:20 AM    [facility] [address] [place], [place], [place]  [address] Phone: [phone]; Fax: [phone]

## 2023-03-19 ENCOUNTER — Ambulatory Visit: Payer: Medicare HMO | Attending: Cardiology | Admitting: Interventional Cardiology

## 2023-03-19 ENCOUNTER — Ambulatory Visit: Payer: Medicare HMO | Admitting: Cardiology

## 2023-03-19 ENCOUNTER — Encounter: Payer: Self-pay | Admitting: Interventional Cardiology

## 2023-03-19 VITALS — BP 124/88 | HR 55 | Ht 64.0 in | Wt 215.4 lb

## 2023-03-19 DIAGNOSIS — R011 Cardiac murmur, unspecified: Secondary | ICD-10-CM

## 2023-03-19 DIAGNOSIS — E785 Hyperlipidemia, unspecified: Secondary | ICD-10-CM | POA: Diagnosis not present

## 2023-03-19 DIAGNOSIS — I25709 Atherosclerosis of coronary artery bypass graft(s), unspecified, with unspecified angina pectoris: Secondary | ICD-10-CM | POA: Diagnosis not present

## 2023-03-19 DIAGNOSIS — G4733 Obstructive sleep apnea (adult) (pediatric): Secondary | ICD-10-CM | POA: Diagnosis not present

## 2023-03-19 DIAGNOSIS — I10 Essential (primary) hypertension: Secondary | ICD-10-CM | POA: Diagnosis not present

## 2023-03-19 NOTE — Patient Instructions (Signed)
Medication Instructions:  Your physician recommends that you continue on your current medications as directed. Please refer to the Current Medication list given to you today.  *If you need a refill on your cardiac medications before your next appointment, please call your pharmacy*   Lab Work: Lab work to be done today--CMET, Lipids, CBC If you have labs (blood work) drawn today and your tests are completely normal, you will receive your results only by: MyChart Message (if you have MyChart) OR A paper copy in the mail If you have any lab test that is abnormal or we need to change your treatment, we will call you to review the results.   Testing/Procedures: none   Follow-Up: At [facility], you and your health needs are our priority.  As part of our continuing mission to provide you with exceptional heart care, we have created designated Provider Care Teams.  These Care Teams include your primary Cardiologist (physician) and Advanced Practice Providers (APPs -  Physician Assistants and Nurse Practitioners) who all work together to provide you with the care you need, when you need it.  We recommend signing up for the patient portal called "MyChart".  Sign up information is provided on this After Visit Summary.  MyChart is used to connect with patients for Virtual Visits (Telemedicine).  Patients are able to view lab/test results, encounter notes, upcoming appointments, etc.  Non-urgent messages can be sent to your provider as well.   To learn more about what you can do with MyChart, go to [email or web address].    Your next appointment:   12 month(s)  Provider:   Lance Muss, MD     Other Instructions

## 2023-03-20 LAB — COMPREHENSIVE METABOLIC PANEL
ALT: 13 IU/L (ref 0–32)
AST: 18 IU/L (ref 0–40)
Albumin: 3.8 g/dL (ref 3.8–4.8)
Alkaline Phosphatase: 146 IU/L — ABNORMAL HIGH (ref 44–121)
BUN/Creatinine Ratio: 14 (ref 12–28)
BUN: 10 mg/dL (ref 8–27)
Bilirubin Total: 0.6 mg/dL (ref 0.0–1.2)
CO2: 26 mmol/L (ref 20–29)
Calcium: 9.2 mg/dL (ref 8.7–10.3)
Chloride: 107 mmol/L — ABNORMAL HIGH (ref 96–106)
Creatinine, Ser: 0.74 mg/dL (ref 0.57–1.00)
Globulin, Total: 2.9 g/dL (ref 1.5–4.5)
Glucose: 87 mg/dL (ref 70–99)
Potassium: 3.9 mmol/L (ref 3.5–5.2)
Sodium: 145 mmol/L — ABNORMAL HIGH (ref 134–144)
Total Protein: 6.7 g/dL (ref 6.0–8.5)
eGFR: 86 mL/min/{1.73_m2} (ref >59)

## 2023-03-20 LAB — LIPID PANEL
Chol/HDL Ratio: 3.5 ratio (ref 0.0–4.4)
Cholesterol, Total: 156 mg/dL (ref 100–199)
HDL: 44 mg/dL (ref >39)
LDL Chol Calc (NIH): 92 mg/dL (ref 0–99)
Triglycerides: 112 mg/dL (ref 0–149)
VLDL Cholesterol Cal: 20 mg/dL (ref 5–40)

## 2023-03-20 LAB — CBC
Hematocrit: 40 % (ref 34.0–46.6)
Hemoglobin: 12.7 g/dL (ref 11.1–15.9)
MCH: 28.6 pg (ref 26.6–33.0)
MCHC: 31.8 g/dL (ref 31.5–35.7)
MCV: 90 fL (ref 79–97)
Platelets: 174 10*3/uL (ref 150–450)
RBC: 4.44 x10E6/uL (ref 3.77–5.28)
RDW: 13.3 % (ref 11.7–15.4)
WBC: 5.4 10*3/uL (ref 3.4–10.8)

## 2023-03-24 ENCOUNTER — Telehealth: Payer: Self-pay | Admitting: *Deleted

## 2023-03-24 DIAGNOSIS — E785 Hyperlipidemia, unspecified: Secondary | ICD-10-CM

## 2023-03-24 NOTE — Telephone Encounter (Signed)
-----   Message from Corky Crafts, MD sent at 03/23/2023  1:42 PM EDT ----- Felicia Petersen, please see if she feels better on the 20 mg dose.  If so, add Zetia 10 mg daily.  If still having pain, would refer to pharm D lipid clinic.

## 2023-03-24 NOTE — Telephone Encounter (Signed)
Left message to call office

## 2023-03-25 MED ORDER — ROSUVASTATIN CALCIUM 20 MG PO TABS: 20.0000 mg | ORAL_TABLET | Freq: Every day | ORAL | 3 refills | Status: DC

## 2023-03-25 MED ORDER — EZETIMIBE 10 MG PO TABS: 10.0000 mg | ORAL_TABLET | Freq: Every day | ORAL | 3 refills | Status: AC

## 2023-03-25 NOTE — Telephone Encounter (Signed)
Results reviewed with patient.  She feels better on Rosuvastatin 20 mg daily.  She will add Zetia 10 mg daily and come in for fasting lab work on June 25, 2023.

## 2023-03-25 NOTE — Telephone Encounter (Signed)
Patient is returning call. Transferred to Patricia, RN.  

## 2023-04-08 DIAGNOSIS — F5101 Primary insomnia: Secondary | ICD-10-CM | POA: Diagnosis not present

## 2023-04-08 DIAGNOSIS — F419 Anxiety disorder, unspecified: Secondary | ICD-10-CM | POA: Diagnosis not present

## 2023-04-08 DIAGNOSIS — I700 Atherosclerosis of aorta: Secondary | ICD-10-CM | POA: Diagnosis not present

## 2023-04-08 DIAGNOSIS — G4733 Obstructive sleep apnea (adult) (pediatric): Secondary | ICD-10-CM | POA: Diagnosis not present

## 2023-04-08 DIAGNOSIS — Z0000 Encounter for general adult medical examination without abnormal findings: Secondary | ICD-10-CM | POA: Diagnosis not present

## 2023-04-08 DIAGNOSIS — I2510 Atherosclerotic heart disease of native coronary artery without angina pectoris: Secondary | ICD-10-CM | POA: Diagnosis not present

## 2023-04-08 DIAGNOSIS — I10 Essential (primary) hypertension: Secondary | ICD-10-CM | POA: Diagnosis not present

## 2023-04-08 DIAGNOSIS — E785 Hyperlipidemia, unspecified: Secondary | ICD-10-CM | POA: Diagnosis not present

## 2023-04-08 DIAGNOSIS — E041 Nontoxic single thyroid nodule: Secondary | ICD-10-CM | POA: Diagnosis not present

## 2023-04-08 DIAGNOSIS — E559 Vitamin D deficiency, unspecified: Secondary | ICD-10-CM | POA: Diagnosis not present

## 2023-04-14 ENCOUNTER — Encounter: Payer: Self-pay | Admitting: Obstetrics and Gynecology

## 2023-04-14 ENCOUNTER — Ambulatory Visit (INDEPENDENT_AMBULATORY_CARE_PROVIDER_SITE_OTHER): Payer: Medicare HMO | Admitting: Obstetrics and Gynecology

## 2023-04-14 VITALS — BP 130/83 | HR 54 | Ht 63.0 in | Wt 214.0 lb

## 2023-04-14 DIAGNOSIS — Z01419 Encounter for gynecological examination (general) (routine) without abnormal findings: Secondary | ICD-10-CM

## 2023-04-14 NOTE — Progress Notes (Signed)
Pt is in office for routine exam.

## 2023-04-14 NOTE — Progress Notes (Signed)
Subjective:     Felicia Petersen is a 72 y.o. female postmenopausal with BMI 37 who is here for a comprehensive physical exam. The patient reports no problems. She denies any episodes of postmenopausal vaginal bleeding. She is not sexually active. She denies any urinary incontinence. She denies pelvic pain or abnormal discharge. Patient is without any complaints  Past Medical History:  Diagnosis Date   Hyperlipidemia    Hypertension    Past Surgical History:  Procedure Laterality Date   BREAST EXCISIONAL BIOPSY Right    BYPASS GRAFT     triple bypass   REPLACEMENT TOTAL KNEE Left    Family History  Problem Relation Age of Onset   Cancer Mother    Heart disease Father    Cancer Sister    Heart disease Sister    Diabetes Brother    Breast cancer Maternal Aunt     Social History   Socioeconomic History   Marital status: Single    Spouse name: Not on file   Number of children: Not on file   Years of education: Not on file   Highest education level: Not on file  Occupational History   Not on file  Tobacco Use   Smoking status: Former   Smokeless tobacco: Never  Vaping Use   Vaping status: Never Used  Substance and Sexual Activity   Alcohol use: No   Drug use: No   Sexual activity: Never  Other Topics Concern   Not on file  Social History Narrative   Not on file   Social Determinants of Health   Financial Resource Strain: Not on file  Food Insecurity: Not on file  Transportation Needs: Not on file  Physical Activity: Not on file  Stress: Not on file  Social Connections: Not on file  Intimate Partner Violence: Not on file   Health Maintenance  Topic Date Due   Hepatitis C Screening  Never done   DTaP/Tdap/Td (1 - Tdap) Never done   Zoster Vaccines- Shingrix (1 of 2) Never done   Pneumonia Vaccine 54+ Years old (1 of 1 - PCV) Never done   COVID-19 Vaccine (1 - 2023-24 season) Never done   Medicare Annual Wellness (AWV)  12/06/2022   INFLUENZA VACCINE   04/23/2023   MAMMOGRAM  07/16/2024   Colonoscopy  05/02/2032   DEXA SCAN  Completed   HPV VACCINES  Aged Out       Review of Systems Pertinent items noted in HPI and remainder of comprehensive ROS otherwise negative.   Objective:  Blood pressure 130/83, pulse (!) 54, height 5\' 3"  (1.6 m), weight 214 lb (97.1 kg).   GENERAL: Well-developed, well-nourished female in no acute distress.  HEENT: Normocephalic, atraumatic. Sclerae anicteric.  NECK: Supple. Normal thyroid.  LUNGS: Clear to auscultation bilaterally.  HEART: Regular rate and rhythm. BREASTS: Symmetric in size. No palpable masses or lymphadenopathy, skin changes, or nipple drainage. ABDOMEN: Soft, nontender, nondistended. No organomegaly. PELVIC: Normal external female genitalia. Vagina is pale.  Normal discharge. Normal appearing cervix. Uterus is normal in size. No adnexal mass or tenderness. Chaperone present during the pelvic exam EXTREMITIES: No cyanosis, clubbing, or edema, 2+ distal pulses.     Assessment:    Healthy female exam.      Plan:    Pap smear not indicated Screening mammogram due 08/2023 Patient current on colonoscopy and Dexa scan Recent health maintenance labs with PCP in June See After Visit Summary for Counseling Recommendations

## 2023-06-08 DIAGNOSIS — H524 Presbyopia: Secondary | ICD-10-CM | POA: Diagnosis not present

## 2023-06-08 DIAGNOSIS — H04123 Dry eye syndrome of bilateral lacrimal glands: Secondary | ICD-10-CM | POA: Diagnosis not present

## 2023-06-08 DIAGNOSIS — H25813 Combined forms of age-related cataract, bilateral: Secondary | ICD-10-CM | POA: Diagnosis not present

## 2023-06-08 DIAGNOSIS — H35033 Hypertensive retinopathy, bilateral: Secondary | ICD-10-CM | POA: Diagnosis not present

## 2023-06-08 DIAGNOSIS — H40013 Open angle with borderline findings, low risk, bilateral: Secondary | ICD-10-CM | POA: Diagnosis not present

## 2023-06-11 ENCOUNTER — Other Ambulatory Visit: Payer: Self-pay | Admitting: Family Medicine

## 2023-06-11 DIAGNOSIS — E041 Nontoxic single thyroid nodule: Secondary | ICD-10-CM

## 2023-06-17 ENCOUNTER — Other Ambulatory Visit: Payer: Self-pay | Admitting: Family Medicine

## 2023-06-17 DIAGNOSIS — Z1231 Encounter for screening mammogram for malignant neoplasm of breast: Secondary | ICD-10-CM

## 2023-06-25 ENCOUNTER — Ambulatory Visit: Payer: Medicare HMO | Attending: Interventional Cardiology

## 2023-06-25 DIAGNOSIS — E785 Hyperlipidemia, unspecified: Secondary | ICD-10-CM

## 2023-06-25 LAB — HEPATIC FUNCTION PANEL
ALT: 21 [IU]/L (ref 0–32)
AST: 29 [IU]/L (ref 0–40)
Albumin: 4.2 g/dL (ref 3.8–4.8)
Alkaline Phosphatase: 126 [IU]/L — ABNORMAL HIGH (ref 44–121)
Bilirubin Total: 0.4 mg/dL (ref 0.0–1.2)
Bilirubin, Direct: 0.17 mg/dL (ref 0.00–0.40)
Total Protein: 6.5 g/dL (ref 6.0–8.5)

## 2023-06-25 LAB — LIPID PANEL
Chol/HDL Ratio: 2.2 {ratio} (ref 0.0–4.4)
Cholesterol, Total: 114 mg/dL (ref 100–199)
HDL: 51 mg/dL (ref >39)
LDL Chol Calc (NIH): 51 mg/dL (ref 0–99)
Triglycerides: 54 mg/dL (ref 0–149)
VLDL Cholesterol Cal: 12 mg/dL (ref 5–40)

## 2023-06-26 DIAGNOSIS — R946 Abnormal results of thyroid function studies: Secondary | ICD-10-CM | POA: Diagnosis not present

## 2023-07-20 ENCOUNTER — Ambulatory Visit
Admission: RE | Admit: 2023-07-20 | Discharge: 2023-07-20 | Disposition: A | Discharge status: Home / Self Care | Payer: Medicare HMO | Source: Ambulatory Visit | Attending: Family Medicine | Admitting: Family Medicine

## 2023-07-20 DIAGNOSIS — Z1231 Encounter for screening mammogram for malignant neoplasm of breast: Secondary | ICD-10-CM | POA: Diagnosis not present

## 2023-07-29 ENCOUNTER — Ambulatory Visit
Admission: RE | Admit: 2023-07-29 | Discharge: 2023-07-29 | Disposition: A | Discharge status: Home / Self Care | Payer: Medicare HMO | Source: Ambulatory Visit | Attending: Family Medicine | Admitting: Family Medicine

## 2023-07-29 DIAGNOSIS — E042 Nontoxic multinodular goiter: Secondary | ICD-10-CM | POA: Diagnosis not present

## 2023-07-29 DIAGNOSIS — E041 Nontoxic single thyroid nodule: Secondary | ICD-10-CM

## 2023-10-27 DIAGNOSIS — Z0100 Encounter for examination of eyes and vision without abnormal findings: Secondary | ICD-10-CM | POA: Diagnosis not present

## 2023-11-24 DIAGNOSIS — F5101 Primary insomnia: Secondary | ICD-10-CM | POA: Diagnosis not present

## 2023-11-24 DIAGNOSIS — E049 Nontoxic goiter, unspecified: Secondary | ICD-10-CM | POA: Diagnosis not present

## 2023-11-24 DIAGNOSIS — F419 Anxiety disorder, unspecified: Secondary | ICD-10-CM | POA: Diagnosis not present

## 2023-11-24 DIAGNOSIS — E559 Vitamin D deficiency, unspecified: Secondary | ICD-10-CM | POA: Diagnosis not present

## 2023-11-24 DIAGNOSIS — I2510 Atherosclerotic heart disease of native coronary artery without angina pectoris: Secondary | ICD-10-CM | POA: Diagnosis not present

## 2023-11-24 DIAGNOSIS — R946 Abnormal results of thyroid function studies: Secondary | ICD-10-CM | POA: Diagnosis not present

## 2023-11-24 DIAGNOSIS — I700 Atherosclerosis of aorta: Secondary | ICD-10-CM | POA: Diagnosis not present

## 2023-11-24 DIAGNOSIS — I10 Essential (primary) hypertension: Secondary | ICD-10-CM | POA: Diagnosis not present

## 2023-11-24 DIAGNOSIS — E785 Hyperlipidemia, unspecified: Secondary | ICD-10-CM | POA: Diagnosis not present

## 2023-11-27 DIAGNOSIS — M25551 Pain in right hip: Secondary | ICD-10-CM | POA: Diagnosis not present

## 2023-12-16 DIAGNOSIS — M25561 Pain in right knee: Secondary | ICD-10-CM | POA: Diagnosis not present

## 2023-12-16 DIAGNOSIS — M25551 Pain in right hip: Secondary | ICD-10-CM | POA: Diagnosis not present

## 2023-12-16 DIAGNOSIS — M25511 Pain in right shoulder: Secondary | ICD-10-CM | POA: Diagnosis not present

## 2024-01-28 DIAGNOSIS — G4733 Obstructive sleep apnea (adult) (pediatric): Secondary | ICD-10-CM | POA: Diagnosis not present

## 2024-02-23 ENCOUNTER — Encounter (HOSPITAL_BASED_OUTPATIENT_CLINIC_OR_DEPARTMENT_OTHER): Payer: Self-pay | Admitting: Cardiovascular Disease

## 2024-02-23 ENCOUNTER — Ambulatory Visit (HOSPITAL_BASED_OUTPATIENT_CLINIC_OR_DEPARTMENT_OTHER): Admitting: Cardiovascular Disease

## 2024-02-23 VITALS — BP 146/80 | HR 61 | Ht 64.0 in | Wt 218.0 lb

## 2024-02-23 DIAGNOSIS — I25709 Atherosclerosis of coronary artery bypass graft(s), unspecified, with unspecified angina pectoris: Secondary | ICD-10-CM | POA: Diagnosis not present

## 2024-02-23 DIAGNOSIS — E785 Hyperlipidemia, unspecified: Secondary | ICD-10-CM

## 2024-02-23 DIAGNOSIS — I10 Essential (primary) hypertension: Secondary | ICD-10-CM

## 2024-02-23 MED ORDER — HYDROCHLOROTHIAZIDE 25 MG PO TABS: 25.0000 mg | ORAL_TABLET | Freq: Every day | ORAL | 3 refills | Status: DC

## 2024-02-23 NOTE — Progress Notes (Signed)
 Advanced Hypertension Clinic Initial Assessment:    Date:  02/23/2024   ID:  Felicia Petersen, DOB May 31, 1951, MRN [id number]  PCP:  Victorio Grave, MD  Cardiologist:  Avery Bodo, MD  Nephrologist:  Referring MD: Victorio Grave, MD   CC: Hypertension  History of Present Illness:    Felicia Petersen is a 73 y.o. female with a hx of CAD status post CABG, hypertension, hyperlipidemia, aortic valve sclerosis.  And OSA here to establish care in the Advanced Hypertension Clinic.  She was previously a patient of Dr. Felipe Horton and Dr. Jacquelynn Matter.  She underwent CABG in 2000 (LIMA to LAD, SVG to RI, SVG to OM).  She last saw Dr. Jacquelynn Matter 02/2023.  Blood pressure at that time was 124/88.  Rosuvastatin  was increased.  She saw Dr. Adah Acron White/2025 and blood pressures were uncontrolled on her home machine averaging 149/77.  Heart rate 50.  Blood pressure was elevated in the office as well.  At the time she was on atenolol, amlodipine , olmesartan, and Lasix.  Discussed the use of AI scribe software for clinical note transcription with the patient, who gave verbal consent to proceed.  History of Present Illness Felicia Petersen has a long-standing history of hypertension, with home blood pressure readings typically ranging from 130s to 150s systolic over 80s diastolic. Her blood pressure has improved from previously higher levels. Current medications include amlodipine , atenolol, olmesartan, and Lasix. She also takes rosuvastatin  and ezetimibe  for hyperlipidemia.  She experiences hip pain due to arthritis and bursitis, which affects her ability to exercise regularly. An injection in April provided some relief. Despite this, she remains active by walking in her neighborhood and engaging in activities with her grandchildren.  Her family history is significant for hypertension affecting her mother, father, two sisters, and brother. Her brother has had multiple heart attacks and uses a defibrillator, while her deceased  sister had heart disease. There is no family history of strokes or chronic kidney disease.  In terms of lifestyle, she has significantly reduced her salt and caffeine intake, opting for sea salt and primarily drinking water. She has not consumed Pepsi in ten months and no longer drinks tea. She does not consume alcohol and primarily eats at home, limiting dining out to about eight times a year. For pain management, she uses Tylenol  and takes elderberry and probiotics as supplements.  She uses a CPAP machine regularly for sleep apnea. No chest pain, pressure, palpitations, heart racing, or breathing difficulties are reported.   Previous antihypertensives:   Past Medical History:  Diagnosis Date   Hyperlipidemia    Hypertension     Past Surgical History:  Procedure Laterality Date   BREAST EXCISIONAL BIOPSY Right    BYPASS GRAFT     triple bypass   REPLACEMENT TOTAL KNEE Left     Current Medications: Current Meds  Medication Sig   amLODipine  (NORVASC ) 10 MG tablet Take 1 tablet (10 mg total) by mouth daily.   aspirin 81 MG tablet Take 81 mg by mouth daily.   atenolol (TENORMIN) 50 MG tablet Take 50 mg by mouth daily.   Elderberry 500 MG CAPS Take 500 mg by mouth daily.   hydrochlorothiazide (HYDRODIURIL) 25 MG tablet Take 1 tablet (25 mg total) by mouth daily.   Multiple Vitamin (MULTIVITAMIN) tablet Take 1 tablet by mouth daily.   Multiple Vitamins-Minerals (PRESERVISION AREDS) CAPS Take 1 capsule by mouth daily.   olmesartan (BENICAR) 40 MG tablet Take 40 mg by mouth daily.  potassium chloride SA (KLOR-CON) 20 MEQ tablet Take 20 mEq by mouth daily.   rosuvastatin  (CRESTOR ) 20 MG tablet Take 1 tablet (20 mg total) by mouth daily.   Turmeric (QC TUMERIC COMPLEX PO) Take by mouth.   [DISCONTINUED] furosemide (LASIX) 40 MG tablet Take 40 mg by mouth.     Allergies:   Escitalopram, Fish allergy, Penicillamine, Penicillins, and Trazodone hcl   Social History   Socioeconomic  History   Marital status: Single    Spouse name: Not on file   Number of children: Not on file   Years of education: Not on file   Highest education level: Not on file  Occupational History   Not on file  Tobacco Use   Smoking status: Former   Smokeless tobacco: Never  Vaping Use   Vaping status: Never Used  Substance and Sexual Activity   Alcohol use: No   Drug use: No   Sexual activity: Never  Other Topics Concern   Not on file  Social History Narrative   Not on file   Social Drivers of Health   Financial Resource Strain: Not on file  Food Insecurity: Not on file  Transportation Needs: Not on file  Physical Activity: Not on file  Stress: Not on file  Social Connections: Not on file     Family History: The patient's family history includes Breast cancer in her maternal aunt; Cancer in her mother and sister; Diabetes in her brother; Heart attack in her brother; Heart disease in her father and sister; Heart failure in her brother; Hypertension in her brother, father, mother, and sister.  ROS:   Please see the history of present illness.     All other systems reviewed and are negative.  EKGs/Labs/Other Studies Reviewed:    EKG:  EKG is  ordered today.  EKG Interpretation Date/Time:  Tuesday February 23 2024 07:52:32 EDT Ventricular Rate:  61 PR Interval:  204 QRS Duration:  104 QT Interval:  396 QTC Calculation: 398 R Axis:   6  Text Interpretation: Normal sinus rhythm Moderate voltage criteria for LVH, may be normal variant ( R in aVL , Cornell product ) When compared with ECG of 19-Mar-2023 09:03, No significant change was found Confirmed by Maudine Sos ([id number]) on 02/23/2024 7:55:38 AM         Recent Labs: 03/19/2023: BUN 10; Creatinine, Ser 0.74; Hemoglobin 12.7; Platelets 174; Potassium 3.9; Sodium 145 06/25/2023: ALT 21   Recent Lipid Panel    Component Value Date/Time   CHOL 114 06/25/2023 0727   TRIG 54 06/25/2023 0727   HDL 51 06/25/2023 0727    CHOLHDL 2.2 06/25/2023 0727   LDLCALC 51 06/25/2023 0727    Physical Exam:   VS:  BP (!) 146/80 (BP Location: Right Leg)   Pulse 61   Ht 5\' 4"  (1.626 m)   Wt 218 lb (98.9 kg)   SpO2 97%   BMI 37.42 kg/m  , BMI Body mass index is 37.42 kg/m. GENERAL:  Well appearing HEENT: Pupils equal round and reactive, fundi not visualized, oral mucosa unremarkable NECK:  No jugular venous distention, waveform within normal limits, carotid upstroke brisk and symmetric, no bruits, no thyromegaly LUNGS:  Clear to auscultation bilaterally HEART:  RRR.  PMI not displaced or sustained,S1 and S2 within normal limits, no S3, no S4, no clicks, no rubs, II/VI systolic murmur at the LUSB ABD:  Flat, positive bowel sounds normal in frequency in pitch, no bruits, no rebound, no guarding, no midline  pulsatile mass, no hepatomegaly, no splenomegaly EXT:  2 plus pulses throughout, no edema, no cyanosis no clubbing SKIN:  No rashes no nodules NEURO:  Cranial nerves II through XII grossly intact, motor grossly intact throughout PSYCH:  Cognitively intact, oriented to person place and time   ASSESSMENT/PLAN:    Assessment & Plan # Resistant Hypertension Long-standing hypertension with elevated home readings despite lifestyle modifications. Family history suggests genetic component. Differential includes renal artery stenosis and hyperaldosteronism. Explained renal artery Doppler and lab tests for hyperaldosteronism. - Order renal artery Doppler ultrasound to assess for renal artery stenosis. - Order lab tests to evaluate for hyperaldosteronism. Renin and aldosterone.  - Discontinue furosemide and initiate hydrochlorothiazide 25mg  daily for better blood pressure control. - Continue current antihypertensive regimen with amlodipine , atenolol, and olmesartan.  Consolidate hydrochlorothiazide/amlodipine /olmesartan if this regimen works well. - BMP in 1 week. -Track BP at home and bring to follow up.  # CAD: #  Hyperlipidemia:  Stable without ischemic symptoms.    # Osteoarthritis and bursitis of hip Chronic osteoarthritis and bursitis with pain limiting exercise.  # Obstructive sleep apnea Managed with CPAP therapy, compliant with use.   Screening for Secondary Hypertension:     02/23/2024    8:08 AM  Causes  Drugs/Herbals Screened     - Comments Limits sodium.  Rare caffeine.  No EtOH.  No NSAIDs  Renovascular HTN Screened     - Comments check renal Dopplers  Sleep Apnea Screened     - Comments uses CPAP  Thyroid  Disease Screened  Hyperaldosteronism Screened     - Comments check renin/aldo  Pheochromocytoma N/A  Cushing's Syndrome Screened  Hyperparathyroidism Screened  Coarctation of the Aorta Screened     - Comments BP symmetrci  Compliance Screened    Relevant Labs/Studies:    Latest Ref Rng & Units 03/19/2023   10:15 AM 04/14/2022    7:17 AM 06/28/2018    8:28 AM  Basic Labs  Sodium 134 - 144 mmol/L 145  142  143   Potassium 3.5 - 5.2 mmol/L 3.9  3.9  4.2   Creatinine 0.57 - 1.00 mg/dL 1.61  0.96  0.45                    02/23/2024    8:25 AM  Renovascular   Renal Artery US  Completed Yes        Disposition:    FU with MD/PharmD in 2 months with PharmD/APP 1 year follow-up in general cardiology clinic.   Medication Adjustments/Labs and Tests Ordered: Current medicines are reviewed at length with the patient today.  Concerns regarding medicines are outlined above.  Orders Placed This Encounter  Procedures   Basic metabolic panel with GFR   Aldosterone + renin activity w/ ratio   EKG 12-Lead   VAS US  RENAL ARTERY DUPLEX   Meds ordered this encounter  Medications   hydrochlorothiazide (HYDRODIURIL) 25 MG tablet    Sig: Take 1 tablet (25 mg total) by mouth daily.    Dispense:  90 tablet    Refill:  3    D/C LASIX     Signed, Maudine Sos, MD  02/23/2024 10:18 AM    [facility]

## 2024-02-23 NOTE — Patient Instructions (Addendum)
 Medication Instructions:  STOP FUROSEMIDE   START hydrochlorothiazide 25 MG DAILY   Labwork: BMET/RENIN/ALDOSTERONE IN 1 WEEK   Testing/Procedures: Your physician has requested that you have a renal artery duplex. During this test, an ultrasound is used to evaluate blood flow to the kidneys. Allow one hour for this exam. Do not eat after midnight the day before and avoid carbonated beverages. Take your medications as you usually do.  Follow-Up: 2 TO 3 MONTHS WITH DR [place], CAITLIN W NP, OR KRISTIN A PHARM D   Any Other Special Instructions Will Be Listed Below (If Applicable).  MONITOR YOUR BLOOD PRESSURE TWICE A DAY, LOG IN THE BOOK PROVIDED. BRING THE BOOK AND YOUR BLOOD PRESSURE MACHINE TO YOUR FOLLOW UP    If you need a refill on your cardiac medications before your next appointment, please call your pharmacy.

## 2024-03-01 ENCOUNTER — Other Ambulatory Visit (HOSPITAL_COMMUNITY): Payer: Self-pay | Admitting: Family Medicine

## 2024-03-01 DIAGNOSIS — E785 Hyperlipidemia, unspecified: Secondary | ICD-10-CM | POA: Diagnosis not present

## 2024-03-01 DIAGNOSIS — I10 Essential (primary) hypertension: Secondary | ICD-10-CM | POA: Diagnosis not present

## 2024-03-07 ENCOUNTER — Ambulatory Visit: Payer: Self-pay | Admitting: Cardiovascular Disease

## 2024-03-07 LAB — BASIC METABOLIC PANEL WITH GFR
BUN/Creatinine Ratio: 27 (ref 12–28)
BUN: 17 mg/dL (ref 8–27)
CO2: 23 mmol/L (ref 20–29)
Calcium: 9.2 mg/dL (ref 8.7–10.3)
Chloride: 106 mmol/L (ref 96–106)
Creatinine, Ser: 0.62 mg/dL (ref 0.57–1.00)
Glucose: 88 mg/dL (ref 70–99)
Potassium: 4.3 mmol/L (ref 3.5–5.2)
Sodium: 143 mmol/L (ref 134–144)
eGFR: 94 mL/min/{1.73_m2} (ref >59)

## 2024-03-07 LAB — ALDOSTERONE + RENIN ACTIVITY W/ RATIO
Aldos/Renin Ratio: 42.9 — ABNORMAL HIGH (ref 0.0–30.0)
Aldosterone: 9.7 ng/dL (ref 0.0–30.0)
Renin Activity, Plasma: 0.226 ng/mL/h (ref 0.167–5.380)

## 2024-03-09 ENCOUNTER — Other Ambulatory Visit: Payer: Self-pay | Admitting: Interventional Cardiology

## 2024-03-15 DIAGNOSIS — I10 Essential (primary) hypertension: Secondary | ICD-10-CM | POA: Diagnosis not present

## 2024-03-15 DIAGNOSIS — I2510 Atherosclerotic heart disease of native coronary artery without angina pectoris: Secondary | ICD-10-CM | POA: Diagnosis not present

## 2024-03-21 DIAGNOSIS — I2510 Atherosclerotic heart disease of native coronary artery without angina pectoris: Secondary | ICD-10-CM | POA: Diagnosis not present

## 2024-03-21 DIAGNOSIS — E785 Hyperlipidemia, unspecified: Secondary | ICD-10-CM | POA: Diagnosis not present

## 2024-03-21 DIAGNOSIS — M1711 Unilateral primary osteoarthritis, right knee: Secondary | ICD-10-CM | POA: Diagnosis not present

## 2024-03-24 ENCOUNTER — Ambulatory Visit (HOSPITAL_BASED_OUTPATIENT_CLINIC_OR_DEPARTMENT_OTHER)

## 2024-03-24 DIAGNOSIS — I10 Essential (primary) hypertension: Secondary | ICD-10-CM | POA: Diagnosis not present

## 2024-04-07 NOTE — Telephone Encounter (Signed)
 Pt returning call. She asked if someone could call her before 11:30am

## 2024-04-08 NOTE — Telephone Encounter (Signed)
 Patient returned staff call regarding results.

## 2024-04-13 DIAGNOSIS — I2510 Atherosclerotic heart disease of native coronary artery without angina pectoris: Secondary | ICD-10-CM | POA: Diagnosis not present

## 2024-04-13 DIAGNOSIS — I10 Essential (primary) hypertension: Secondary | ICD-10-CM | POA: Diagnosis not present

## 2024-04-21 DIAGNOSIS — I10 Essential (primary) hypertension: Secondary | ICD-10-CM | POA: Diagnosis not present

## 2024-04-21 DIAGNOSIS — I2510 Atherosclerotic heart disease of native coronary artery without angina pectoris: Secondary | ICD-10-CM | POA: Diagnosis not present

## 2024-04-21 DIAGNOSIS — E785 Hyperlipidemia, unspecified: Secondary | ICD-10-CM | POA: Diagnosis not present

## 2024-04-21 DIAGNOSIS — M1711 Unilateral primary osteoarthritis, right knee: Secondary | ICD-10-CM | POA: Diagnosis not present

## 2024-05-12 ENCOUNTER — Encounter (HOSPITAL_BASED_OUTPATIENT_CLINIC_OR_DEPARTMENT_OTHER): Payer: Self-pay | Admitting: Family

## 2024-05-12 ENCOUNTER — Encounter (HOSPITAL_BASED_OUTPATIENT_CLINIC_OR_DEPARTMENT_OTHER): Admitting: Family

## 2024-05-12 VITALS — BP 130/78 | HR 54 | Ht 64.0 in | Wt 212.0 lb

## 2024-05-12 DIAGNOSIS — I1A0 Resistant hypertension: Secondary | ICD-10-CM | POA: Diagnosis not present

## 2024-05-12 DIAGNOSIS — I25118 Atherosclerotic heart disease of native coronary artery with other forms of angina pectoris: Secondary | ICD-10-CM

## 2024-05-12 DIAGNOSIS — I701 Atherosclerosis of renal artery: Secondary | ICD-10-CM | POA: Diagnosis not present

## 2024-05-12 DIAGNOSIS — E785 Hyperlipidemia, unspecified: Secondary | ICD-10-CM

## 2024-05-12 DIAGNOSIS — G4733 Obstructive sleep apnea (adult) (pediatric): Secondary | ICD-10-CM

## 2024-05-12 MED ORDER — OLMESARTAN-AMLODIPINE-HCTZ 40-10-25 MG PO TABS: 1.0000 | ORAL_TABLET | Freq: Every day | ORAL | 3 refills | Status: AC

## 2024-05-12 NOTE — Progress Notes (Signed)
 [facility] Assessment:    Date:  05/12/2024   ID:  Felicia Petersen, DOB 1951-07-11, MRN [id number]  PCP:  Teresa Channel, MD  Cardiologist:  Candyce Reek, MD  Nephrologist:  Referring MD: Teresa Channel, MD   CC: Hypertension  History of Present Illness:    Felicia Petersen is a 73 y.o. female with a hx of resistant hypertension, renal artery stenosis, CAD s/p CABG in 2000 (LIMA-LAD, SVG-RI, SVG-OM1 (, HLD, OSA here to follow up in the [facility].   Established with [facility] 02/23/24.   Renal artery Dopplers 03/24/2024 bilateral 1-59% stenosis. Renin aldosterone ratio not consistent with hyperaldosteronism. Furosemide was discontinued and hydrochlorothiazide  25 mg daily initiated.  Amlodipine , atenolol, olmesartan  were continued.  Presents today for follow-up independently.  We reviewed her renal artery Dopplers and reassurance provided.  BP readings at home routinely 130s or occasionally lower such as 117/60 which was not associate with lightheadedness or dizziness.  She has no difficulties with her present medications taking daily between 6 to 7 AM and checking BP around 9 AM.  No chest pain, exertional dyspnea, orthopnea, edema.  Activity tolerance limited by right hip and knee orthopedic issues.  Occasional headache relieved by Tylenol .  Previous antihypertensives: Lasix - switched to Hctz  Past Medical History:  Diagnosis Date   Hyperlipidemia    Hypertension     Past Surgical History:  Procedure Laterality Date   BREAST EXCISIONAL BIOPSY Right    BYPASS GRAFT     triple bypass   REPLACEMENT TOTAL KNEE Left     Current Medications: Current Meds  Medication Sig   aspirin 81 MG tablet Take 81 mg by mouth daily.   atenolol (TENORMIN) 50 MG tablet Take 50 mg by mouth daily.   Elderberry 500 MG CAPS Take 500 mg by mouth daily.   Multiple Vitamin (MULTIVITAMIN) tablet Take 1 tablet by mouth daily.   Multiple  Vitamins-Minerals (PRESERVISION AREDS) CAPS Take 1 capsule by mouth daily.   Olmesartan -amLODIPine -HCTZ 40-10-25 MG TABS Take 1 tablet by mouth daily.   potassium chloride SA (KLOR-CON) 20 MEQ tablet Take 20 mEq by mouth daily.   rosuvastatin  (CRESTOR ) 20 MG tablet Take 1 tablet by mouth once daily   Turmeric (QC TUMERIC COMPLEX PO) Take by mouth.   [DISCONTINUED] amLODipine  (NORVASC ) 10 MG tablet Take 1 tablet (10 mg total) by mouth daily.   [DISCONTINUED] hydrochlorothiazide  (HYDRODIURIL ) 25 MG tablet Take 1 tablet (25 mg total) by mouth daily.   [DISCONTINUED] olmesartan  (BENICAR ) 40 MG tablet Take 40 mg by mouth daily.     Allergies:   Escitalopram, Fish allergy, Penicillamine, Penicillins, and Trazodone hcl   Social History   Socioeconomic History   Marital status: Single    Spouse name: Not on file   Number of children: Not on file   Years of education: Not on file   Highest education level: Not on file  Occupational History   Not on file  Tobacco Use   Smoking status: Former   Smokeless tobacco: Never  Vaping Use   Vaping status: Never Used  Substance and Sexual Activity   Alcohol use: No   Drug use: No   Sexual activity: Never  Other Topics Concern   Not on file  Social History Narrative   Not on file   Social Drivers of Health   Financial Resource Strain: Not on file  Food Insecurity: Not on file  Transportation Needs: Not on file  Physical Activity:  Not on file  Stress: Not on file  Social Connections: Not on file     Family History: The patient's family history includes Breast cancer in her maternal aunt; Cancer in her mother and sister; Diabetes in her brother; Heart attack in her brother; Heart disease in her father and sister; Heart failure in her brother; Hypertension in her brother, father, mother, and sister.  ROS:   Please see the history of present illness.     All other systems reviewed and are negative.  EKGs/Labs/Other Studies Reviewed:          Recent Labs: 06/25/2023: ALT 21 03/01/2024: BUN 17; Creatinine, Ser 0.62; Potassium 4.3; Sodium 143   Recent Lipid Panel    Component Value Date/Time   CHOL 114 06/25/2023 0727   TRIG 54 06/25/2023 0727   HDL 51 06/25/2023 0727   CHOLHDL 2.2 06/25/2023 0727   LDLCALC 51 06/25/2023 0727    Physical Exam:   VS:  BP 130/78   Pulse (!) 54   Ht 5' 4 (1.626 m)   Wt 212 lb (96.2 kg)   SpO2 98%   BMI 36.39 kg/m  , BMI Body mass index is 36.39 kg/m.  Vitals:   05/12/24 0756 05/12/24 0813  BP: (!) 160/82 130/78  Pulse: (!) 54   Height: 5' 4 (1.626 m)   Weight: 212 lb (96.2 kg)   SpO2: 98%   BMI (Calculated): 36.37     GENERAL:  Well appearing HEENT: Pupils equal round and reactive, fundi not visualized, oral mucosa unremarkable NECK:  No jugular venous distention, waveform within normal limits, carotid upstroke brisk and symmetric, no bruits, no thyromegaly LYMPHATICS:  No cervical adenopathy LUNGS:  Clear to auscultation bilaterally HEART:  RRR.  PMI not displaced or sustained,S1 and S2 within normal limits, no S3, no S4, no clicks, no rubs, no murmurs ABD:  Flat, positive bowel sounds normal in frequency in pitch, no bruits, no rebound, no guarding, no midline pulsatile mass, no hepatomegaly, no splenomegaly EXT:  2 plus pulses throughout, no edema, no cyanosis no clubbing SKIN:  No rashes no nodules NEURO:  Cranial nerves II through XII grossly intact, motor grossly intact throughout PSYCH:  Cognitively intact, oriented to person place and time   ASSESSMENT/PLAN:    Resistant hypertension -BP at goal less than 130/80 most often by home readings.  Initial BP in clinic 160/82 which improved to 130/78 without intervention.  Continue atenolol 50 mg daily.  Get for easier medication adherence.  Olmesartan , amlodipine , HCTZ and instead start combination tablet olmesartan -amlodipine -HCTZ 40 - 10 - 25 mg daily. Secondary workup 03/01/24 renin aldosterone ratio  unremarkable. OSA treated CT 2021 normal adrenals 04/20/2024 renal artery duplex bilateral 1-59% stenosis. Discussed to monitor BP at home at least 2 hours after medications and sitting for 5-10 minutes   Renal artery stenosis-renal artery duplex 03/24/2024 with mild 1-59% bilateral renal artery stenosis.  Continue lipid control with rosuvastatin  3 times per week and Zetia  10 mg daily.  CAD/HLD, LDL goal less than 70- Stable with no anginal symptoms. No indication for ischemic evaluation.  GDMT aspirin 81 mg daily, atenolol 50 mg daily, Zetia  10 mg daily, rosuvastatin  20 mg 3 times per week. Recommend aiming for 150 minutes of moderate intensity activity per week and following a heart healthy diet.  06/2023 LDL 24  OSA-- CPAP compliance encouraged.  Endorses wearing regularly.  Screening for Secondary Hypertension:     02/23/2024    8:08 AM  Causes  Drugs/Herbals  Screened     - Comments Limits sodium.  Rare caffeine.  No EtOH.  No NSAIDs  Renovascular HTN Screened     - Comments check renal Dopplers  Sleep Apnea Screened     - Comments uses CPAP  Thyroid  Disease Screened  Hyperaldosteronism Screened     - Comments check renin/aldo  Pheochromocytoma N/A  Cushing's Syndrome Screened  Hyperparathyroidism Screened  Coarctation of the Aorta Screened     - Comments BP symmetrci  Compliance Screened    Relevant Labs/Studies:    Latest Ref Rng & Units 03/01/2024    8:08 AM 03/19/2023   10:15 AM 04/14/2022    7:17 AM  Basic Labs  Sodium 134 - 144 mmol/L 143  145  142   Potassium 3.5 - 5.2 mmol/L 4.3  3.9  3.9   Creatinine 0.57 - 1.00 mg/dL 9.37  9.25  9.24           Latest Ref Rng & Units 03/01/2024    8:08 AM  Renin/Aldosterone   Aldosterone 0.0 - 30.0 ng/dL 9.7   Aldos/Renin Ratio 0.0 - 30.0 42.9              03/24/2024    9:47 AM  Renovascular   Renal Artery US  Completed Yes     Disposition:    FU with MD/APP/PharmD in 6 months    Medication Adjustments/Labs and Tests  Ordered: Current medicines are reviewed at length with the patient today.  Concerns regarding medicines are outlined above.  No orders of the defined types were placed in this encounter.  Meds ordered this encounter  Medications   Olmesartan -amLODIPine -HCTZ 40-10-25 MG TABS    Sig: Take 1 tablet by mouth daily.    Dispense:  90 tablet    Refill:  3    Supervising Provider:   LONNI SLAIN [[id number]]     Signed, Reche GORMAN Finder, NP  05/12/2024 8:52 AM    [facility]

## 2024-05-12 NOTE — Patient Instructions (Signed)
 Medication Instructions:   DISCONTINUE Olmesartan   DISCONTINUE Amlodipine   DISCONTINUE hydrochlorothiazide   START Olmesartan -Amlodipine -hydrochlorothiazide  one ( 1) tablet by mouth ( 40-10-25 mg) daily.   *If you need a refill on your cardiac medications before your next appointment, please call your pharmacy*  Lab Work:  None ordered.  If you have labs (blood work) drawn today and your tests are completely normal, you will receive your results only by: MyChart Message (if you have MyChart) OR A paper copy in the mail If you have any lab test that is abnormal or we need to change your treatment, we will call you to review the results.  Testing/Procedures:  None ordered.  Follow-Up: At [facility], you and your health needs are our priority.  As part of our continuing mission to provide you with exceptional heart care, our providers are all part of one team.  This team includes your primary Cardiologist (physician) and Advanced Practice Providers or APPs (Physician Assistants and Nurse Practitioners) who all work together to provide you with the care you need, when you need it.  Your next appointment:   6 month(s) with [facility] clinic.   Provider:   Annabella Scarce, MD or Reche Finder, NP    We recommend signing up for the patient portal called MyChart.  Sign up information is provided on this After Visit Summary.  MyChart is used to connect with patients for Virtual Visits (Telemedicine).  Patients are able to view lab/test results, encounter notes, upcoming appointments, etc.  Non-urgent messages can be sent to your provider as well.   To learn more about what you can do with MyChart, go to [email or web address].   Other Instructions Your physician wants you to follow-up in: 6 months. You will receive a reminder letter in the mail two months in advance. If you don't receive a letter, please call our office to schedule the follow-up  appointment.

## 2024-05-13 DIAGNOSIS — I2510 Atherosclerotic heart disease of native coronary artery without angina pectoris: Secondary | ICD-10-CM | POA: Diagnosis not present

## 2024-05-13 DIAGNOSIS — I10 Essential (primary) hypertension: Secondary | ICD-10-CM | POA: Diagnosis not present

## 2024-05-22 DIAGNOSIS — I10 Essential (primary) hypertension: Secondary | ICD-10-CM | POA: Diagnosis not present

## 2024-05-22 DIAGNOSIS — I2510 Atherosclerotic heart disease of native coronary artery without angina pectoris: Secondary | ICD-10-CM | POA: Diagnosis not present

## 2024-05-22 DIAGNOSIS — E785 Hyperlipidemia, unspecified: Secondary | ICD-10-CM | POA: Diagnosis not present

## 2024-05-22 DIAGNOSIS — M1711 Unilateral primary osteoarthritis, right knee: Secondary | ICD-10-CM | POA: Diagnosis not present

## 2024-05-27 DIAGNOSIS — I10 Essential (primary) hypertension: Secondary | ICD-10-CM | POA: Diagnosis not present

## 2024-05-27 DIAGNOSIS — Z23 Encounter for immunization: Secondary | ICD-10-CM | POA: Diagnosis not present

## 2024-05-27 DIAGNOSIS — I2510 Atherosclerotic heart disease of native coronary artery without angina pectoris: Secondary | ICD-10-CM | POA: Diagnosis not present

## 2024-05-27 DIAGNOSIS — Z1331 Encounter for screening for depression: Secondary | ICD-10-CM | POA: Diagnosis not present

## 2024-05-27 DIAGNOSIS — E049 Nontoxic goiter, unspecified: Secondary | ICD-10-CM | POA: Diagnosis not present

## 2024-05-27 DIAGNOSIS — F419 Anxiety disorder, unspecified: Secondary | ICD-10-CM | POA: Diagnosis not present

## 2024-05-27 DIAGNOSIS — M1711 Unilateral primary osteoarthritis, right knee: Secondary | ICD-10-CM | POA: Diagnosis not present

## 2024-05-27 DIAGNOSIS — E785 Hyperlipidemia, unspecified: Secondary | ICD-10-CM | POA: Diagnosis not present

## 2024-05-27 DIAGNOSIS — I700 Atherosclerosis of aorta: Secondary | ICD-10-CM | POA: Diagnosis not present

## 2024-05-27 DIAGNOSIS — Z0000 Encounter for general adult medical examination without abnormal findings: Secondary | ICD-10-CM | POA: Diagnosis not present

## 2024-06-06 DIAGNOSIS — H524 Presbyopia: Secondary | ICD-10-CM | POA: Diagnosis not present

## 2024-06-06 DIAGNOSIS — H40013 Open angle with borderline findings, low risk, bilateral: Secondary | ICD-10-CM | POA: Diagnosis not present

## 2024-06-06 DIAGNOSIS — H25813 Combined forms of age-related cataract, bilateral: Secondary | ICD-10-CM | POA: Diagnosis not present

## 2024-06-06 DIAGNOSIS — H35031 Hypertensive retinopathy, right eye: Secondary | ICD-10-CM | POA: Diagnosis not present

## 2024-06-12 DIAGNOSIS — I10 Essential (primary) hypertension: Secondary | ICD-10-CM | POA: Diagnosis not present

## 2024-06-12 DIAGNOSIS — I2510 Atherosclerotic heart disease of native coronary artery without angina pectoris: Secondary | ICD-10-CM | POA: Diagnosis not present

## 2024-06-15 DIAGNOSIS — M1711 Unilateral primary osteoarthritis, right knee: Secondary | ICD-10-CM | POA: Diagnosis not present

## 2024-06-15 DIAGNOSIS — M25551 Pain in right hip: Secondary | ICD-10-CM | POA: Diagnosis not present

## 2024-06-15 DIAGNOSIS — M25512 Pain in left shoulder: Secondary | ICD-10-CM | POA: Diagnosis not present

## 2024-06-15 DIAGNOSIS — M25511 Pain in right shoulder: Secondary | ICD-10-CM | POA: Diagnosis not present

## 2024-06-20 ENCOUNTER — Other Ambulatory Visit: Payer: Self-pay | Admitting: Family Medicine

## 2024-06-20 DIAGNOSIS — Z0000 Encounter for general adult medical examination without abnormal findings: Secondary | ICD-10-CM

## 2024-06-21 DIAGNOSIS — E785 Hyperlipidemia, unspecified: Secondary | ICD-10-CM | POA: Diagnosis not present

## 2024-06-21 DIAGNOSIS — M1711 Unilateral primary osteoarthritis, right knee: Secondary | ICD-10-CM | POA: Diagnosis not present

## 2024-06-21 DIAGNOSIS — I2510 Atherosclerotic heart disease of native coronary artery without angina pectoris: Secondary | ICD-10-CM | POA: Diagnosis not present

## 2024-06-21 DIAGNOSIS — I10 Essential (primary) hypertension: Secondary | ICD-10-CM | POA: Diagnosis not present

## 2024-07-12 DIAGNOSIS — I2510 Atherosclerotic heart disease of native coronary artery without angina pectoris: Secondary | ICD-10-CM | POA: Diagnosis not present

## 2024-07-12 DIAGNOSIS — I10 Essential (primary) hypertension: Secondary | ICD-10-CM | POA: Diagnosis not present

## 2024-07-13 DIAGNOSIS — Z6838 Body mass index (BMI) 38.0-38.9, adult: Secondary | ICD-10-CM | POA: Diagnosis not present

## 2024-07-13 DIAGNOSIS — I129 Hypertensive chronic kidney disease with stage 1 through stage 4 chronic kidney disease, or unspecified chronic kidney disease: Secondary | ICD-10-CM | POA: Diagnosis not present

## 2024-07-13 DIAGNOSIS — E785 Hyperlipidemia, unspecified: Secondary | ICD-10-CM | POA: Diagnosis not present

## 2024-07-13 DIAGNOSIS — M1990 Unspecified osteoarthritis, unspecified site: Secondary | ICD-10-CM | POA: Diagnosis not present

## 2024-07-13 DIAGNOSIS — E079 Disorder of thyroid, unspecified: Secondary | ICD-10-CM | POA: Diagnosis not present

## 2024-07-13 DIAGNOSIS — F325 Major depressive disorder, single episode, in full remission: Secondary | ICD-10-CM | POA: Diagnosis not present

## 2024-07-13 DIAGNOSIS — I701 Atherosclerosis of renal artery: Secondary | ICD-10-CM | POA: Diagnosis not present

## 2024-07-13 DIAGNOSIS — H3530 Unspecified macular degeneration: Secondary | ICD-10-CM | POA: Diagnosis not present

## 2024-07-13 DIAGNOSIS — G4733 Obstructive sleep apnea (adult) (pediatric): Secondary | ICD-10-CM | POA: Diagnosis not present

## 2024-07-13 DIAGNOSIS — E876 Hypokalemia: Secondary | ICD-10-CM | POA: Diagnosis not present

## 2024-07-13 DIAGNOSIS — I25119 Atherosclerotic heart disease of native coronary artery with unspecified angina pectoris: Secondary | ICD-10-CM | POA: Diagnosis not present

## 2024-07-13 DIAGNOSIS — N181 Chronic kidney disease, stage 1: Secondary | ICD-10-CM | POA: Diagnosis not present

## 2024-07-13 DIAGNOSIS — I700 Atherosclerosis of aorta: Secondary | ICD-10-CM | POA: Diagnosis not present

## 2024-07-18 ENCOUNTER — Encounter: Payer: Self-pay | Admitting: Obstetrics and Gynecology

## 2024-07-18 ENCOUNTER — Ambulatory Visit: Admitting: Obstetrics and Gynecology

## 2024-07-18 DIAGNOSIS — Z780 Asymptomatic menopausal state: Secondary | ICD-10-CM | POA: Diagnosis not present

## 2024-07-19 NOTE — Progress Notes (Signed)
 Patient not seen by provider. Patient cancelled the appointment as she does not have any gynecologic complaints

## 2024-07-20 ENCOUNTER — Ambulatory Visit

## 2024-07-22 DIAGNOSIS — E785 Hyperlipidemia, unspecified: Secondary | ICD-10-CM | POA: Diagnosis not present

## 2024-07-22 DIAGNOSIS — I10 Essential (primary) hypertension: Secondary | ICD-10-CM | POA: Diagnosis not present

## 2024-07-22 DIAGNOSIS — I2510 Atherosclerotic heart disease of native coronary artery without angina pectoris: Secondary | ICD-10-CM | POA: Diagnosis not present

## 2024-07-22 DIAGNOSIS — M1711 Unilateral primary osteoarthritis, right knee: Secondary | ICD-10-CM | POA: Diagnosis not present

## 2024-07-29 ENCOUNTER — Ambulatory Visit
Admission: RE | Admit: 2024-07-29 | Discharge: 2024-07-29 | Disposition: A | Discharge status: Home / Self Care | Source: Ambulatory Visit | Attending: Family Medicine | Admitting: Family Medicine

## 2024-07-29 DIAGNOSIS — Z0000 Encounter for general adult medical examination without abnormal findings: Secondary | ICD-10-CM

## 2024-07-29 DIAGNOSIS — Z1231 Encounter for screening mammogram for malignant neoplasm of breast: Secondary | ICD-10-CM | POA: Diagnosis not present

## 2024-08-03 ENCOUNTER — Other Ambulatory Visit: Payer: Self-pay | Admitting: Family Medicine

## 2024-08-03 DIAGNOSIS — E041 Nontoxic single thyroid nodule: Secondary | ICD-10-CM

## 2024-08-09 ENCOUNTER — Encounter: Payer: Self-pay | Admitting: Family Medicine

## 2024-08-11 DIAGNOSIS — I10 Essential (primary) hypertension: Secondary | ICD-10-CM | POA: Diagnosis not present

## 2024-08-11 DIAGNOSIS — I2510 Atherosclerotic heart disease of native coronary artery without angina pectoris: Secondary | ICD-10-CM | POA: Diagnosis not present

## 2024-08-15 ENCOUNTER — Ambulatory Visit
Admission: RE | Admit: 2024-08-15 | Discharge: 2024-08-15 | Disposition: A | Source: Ambulatory Visit | Attending: Family Medicine | Admitting: Family Medicine

## 2024-08-15 DIAGNOSIS — E041 Nontoxic single thyroid nodule: Secondary | ICD-10-CM

## 2024-08-15 DIAGNOSIS — E042 Nontoxic multinodular goiter: Secondary | ICD-10-CM | POA: Diagnosis not present

## 2024-08-21 DIAGNOSIS — I10 Essential (primary) hypertension: Secondary | ICD-10-CM | POA: Diagnosis not present

## 2024-08-21 DIAGNOSIS — M1711 Unilateral primary osteoarthritis, right knee: Secondary | ICD-10-CM | POA: Diagnosis not present

## 2024-08-21 DIAGNOSIS — E785 Hyperlipidemia, unspecified: Secondary | ICD-10-CM | POA: Diagnosis not present

## 2024-08-21 DIAGNOSIS — I2510 Atherosclerotic heart disease of native coronary artery without angina pectoris: Secondary | ICD-10-CM | POA: Diagnosis not present

## 2024-08-25 DIAGNOSIS — M1711 Unilateral primary osteoarthritis, right knee: Secondary | ICD-10-CM | POA: Diagnosis not present
